# Patient Record
Sex: Female | Born: 1935 | Race: White | Hispanic: No | Smoking: Never smoker
Health system: Southern US, Community
[De-identification: ages and names within clinical notes are randomized; demographics above are authoritative.]

## PROBLEM LIST (undated history)

## (undated) DIAGNOSIS — N390 Urinary tract infection, site not specified: Secondary | ICD-10-CM

## (undated) DIAGNOSIS — C50919 Malignant neoplasm of unspecified site of unspecified female breast: Secondary | ICD-10-CM

## (undated) DIAGNOSIS — I10 Essential (primary) hypertension: Secondary | ICD-10-CM

## (undated) HISTORY — PX: BREAST LUMPECTOMY: SHX2

---

## 2013-09-01 LAB — CBC WITH DIFFERENTIAL/PLATELET
BASOS ABS: 0.1 10*3/uL (ref 0.0–0.1)
Basophil %: 0.5 %
EOS ABS: 0.3 10*3/uL (ref 0.0–0.7)
Eosinophil %: 1.5 %
HCT: 45.4 % (ref 35.0–47.0)
HGB: 14.3 g/dL (ref 12.0–16.0)
LYMPHS ABS: 2.5 10*3/uL (ref 1.0–3.6)
Lymphocyte %: 11.3 %
MCH: 29.7 pg (ref 26.0–34.0)
MCHC: 31.4 g/dL — AB (ref 32.0–36.0)
MCV: 95 fL (ref 80–100)
Monocyte #: 0.5 x10 3/mm (ref 0.2–0.9)
Monocyte %: 2.2 %
Neutrophil #: 18.7 10*3/uL — ABNORMAL HIGH (ref 1.4–6.5)
Neutrophil %: 84.5 %
PLATELETS: 275 10*3/uL (ref 150–440)
RBC: 4.79 10*6/uL (ref 3.80–5.20)
RDW: 13.4 % (ref 11.5–14.5)
WBC: 22.2 10*3/uL — ABNORMAL HIGH (ref 3.6–11.0)

## 2013-09-01 LAB — COMPREHENSIVE METABOLIC PANEL
Albumin: 3.9 g/dL (ref 3.4–5.0)
Alkaline Phosphatase: 72 U/L
Anion Gap: 13 (ref 7–16)
BILIRUBIN TOTAL: 0.4 mg/dL (ref 0.2–1.0)
BUN: 23 mg/dL — AB (ref 7–18)
CALCIUM: 9.7 mg/dL (ref 8.5–10.1)
CHLORIDE: 103 mmol/L (ref 98–107)
CO2: 25 mmol/L (ref 21–32)
Creatinine: 1.58 mg/dL — ABNORMAL HIGH (ref 0.60–1.30)
EGFR (Non-African Amer.): 30 — ABNORMAL LOW
GFR CALC AF AMER: 35 — AB
Glucose: 215 mg/dL — ABNORMAL HIGH (ref 65–99)
Osmolality: 291 (ref 275–301)
Potassium: 3.5 mmol/L (ref 3.5–5.1)
SGOT(AST): 39 U/L — ABNORMAL HIGH (ref 15–37)
SGPT (ALT): 28 U/L
SODIUM: 141 mmol/L (ref 136–145)
Total Protein: 8.1 g/dL (ref 6.4–8.2)

## 2013-09-01 LAB — LIPASE, BLOOD: LIPASE: 146 U/L (ref 73–393)

## 2013-09-02 ENCOUNTER — Inpatient Hospital Stay: Payer: Self-pay | Admitting: Internal Medicine

## 2013-09-02 LAB — CBC WITH DIFFERENTIAL/PLATELET
Basophil #: 0.1 10*3/uL (ref 0.0–0.1)
Basophil %: 0.3 %
EOS PCT: 0.1 %
Eosinophil #: 0 10*3/uL (ref 0.0–0.7)
HCT: 37.2 % (ref 35.0–47.0)
HGB: 12 g/dL (ref 12.0–16.0)
LYMPHS PCT: 6 %
Lymphocyte #: 1.1 10*3/uL (ref 1.0–3.6)
MCH: 30.4 pg (ref 26.0–34.0)
MCHC: 32.4 g/dL (ref 32.0–36.0)
MCV: 94 fL (ref 80–100)
Monocyte #: 1.3 x10 3/mm — ABNORMAL HIGH (ref 0.2–0.9)
Monocyte %: 7.1 %
Neutrophil #: 16 10*3/uL — ABNORMAL HIGH (ref 1.4–6.5)
Neutrophil %: 86.5 %
PLATELETS: 201 10*3/uL (ref 150–440)
RBC: 3.96 10*6/uL (ref 3.80–5.20)
RDW: 13.2 % (ref 11.5–14.5)
WBC: 18.5 10*3/uL — ABNORMAL HIGH (ref 3.6–11.0)

## 2013-09-02 LAB — URINALYSIS, COMPLETE
BACTERIA: NONE SEEN
Bilirubin,UR: NEGATIVE
Glucose,UR: 50 mg/dL (ref 0–75)
KETONE: NEGATIVE
Nitrite: NEGATIVE
Ph: 6 (ref 4.5–8.0)
RBC,UR: 2 /HPF (ref 0–5)
Renal Epithelial: <1
SQUAMOUS EPITHELIAL: NONE SEEN
Specific Gravity: 1.01 (ref 1.003–1.030)

## 2013-09-02 LAB — CLOSTRIDIUM DIFFICILE(ARMC)

## 2013-09-02 LAB — TROPONIN I: Troponin-I: <0.02 ng/mL

## 2013-09-03 LAB — CBC WITH DIFFERENTIAL/PLATELET
Basophil #: 0 10*3/uL (ref 0.0–0.1)
Basophil %: 0.3 %
Eosinophil #: 0 10*3/uL (ref 0.0–0.7)
Eosinophil %: 0.1 %
HCT: 34.8 % — ABNORMAL LOW (ref 35.0–47.0)
HGB: 11.7 g/dL — ABNORMAL LOW (ref 12.0–16.0)
Lymphocyte #: 1.6 10*3/uL (ref 1.0–3.6)
Lymphocyte %: 8.3 %
MCH: 31 pg (ref 26.0–34.0)
MCHC: 33.5 g/dL (ref 32.0–36.0)
MCV: 93 fL (ref 80–100)
Monocyte #: 1.6 x10 3/mm — ABNORMAL HIGH (ref 0.2–0.9)
Monocyte %: 8.7 %
NEUTROS ABS: 15.6 10*3/uL — AB (ref 1.4–6.5)
Neutrophil %: 82.6 %
Platelet: 197 10*3/uL (ref 150–440)
RBC: 3.77 10*6/uL — ABNORMAL LOW (ref 3.80–5.20)
RDW: 13.3 % (ref 11.5–14.5)
WBC: 18.9 10*3/uL — ABNORMAL HIGH (ref 3.6–11.0)

## 2013-09-03 LAB — LACTATE DEHYDROGENASE: LDH: 134 U/L (ref 81–246)

## 2013-09-03 LAB — BASIC METABOLIC PANEL
ANION GAP: 9 (ref 7–16)
BUN: 11 mg/dL (ref 7–18)
CHLORIDE: 112 mmol/L — AB (ref 98–107)
CO2: 22 mmol/L (ref 21–32)
Calcium, Total: 8.1 mg/dL — ABNORMAL LOW (ref 8.5–10.1)
Creatinine: 1.04 mg/dL (ref 0.60–1.30)
EGFR (African American): 58 — ABNORMAL LOW
EGFR (Non-African Amer.): 50 — ABNORMAL LOW
Glucose: 124 mg/dL — ABNORMAL HIGH (ref 65–99)
Osmolality: 286 (ref 275–301)
POTASSIUM: 3.9 mmol/L (ref 3.5–5.1)
Sodium: 143 mmol/L (ref 136–145)

## 2013-09-04 LAB — CBC WITH DIFFERENTIAL/PLATELET
BASOS ABS: 0.1 10*3/uL (ref 0.0–0.1)
Basophil %: 0.4 %
EOS PCT: 0.7 %
Eosinophil #: 0.1 10*3/uL (ref 0.0–0.7)
HCT: 29.7 % — ABNORMAL LOW (ref 35.0–47.0)
HGB: 9.7 g/dL — ABNORMAL LOW (ref 12.0–16.0)
LYMPHS ABS: 1.9 10*3/uL (ref 1.0–3.6)
LYMPHS PCT: 11.4 %
MCH: 30.7 pg (ref 26.0–34.0)
MCHC: 32.7 g/dL (ref 32.0–36.0)
MCV: 94 fL (ref 80–100)
MONO ABS: 1 x10 3/mm — AB (ref 0.2–0.9)
Monocyte %: 6.4 %
NEUTROS ABS: 13.2 10*3/uL — AB (ref 1.4–6.5)
NEUTROS PCT: 81.1 %
PLATELETS: 151 10*3/uL (ref 150–440)
RBC: 3.16 10*6/uL — ABNORMAL LOW (ref 3.80–5.20)
RDW: 13.5 % (ref 11.5–14.5)
WBC: 16.3 10*3/uL — ABNORMAL HIGH (ref 3.6–11.0)

## 2013-09-05 LAB — CBC WITH DIFFERENTIAL/PLATELET
BASOS PCT: 0.5 %
Basophil #: 0.1 10*3/uL (ref 0.0–0.1)
Eosinophil #: 0.3 10*3/uL (ref 0.0–0.7)
Eosinophil %: 2.8 %
HCT: 29.7 % — AB (ref 35.0–47.0)
HGB: 9.7 g/dL — ABNORMAL LOW (ref 12.0–16.0)
LYMPHS ABS: 1.6 10*3/uL (ref 1.0–3.6)
Lymphocyte %: 13.8 %
MCH: 30.4 pg (ref 26.0–34.0)
MCHC: 32.6 g/dL (ref 32.0–36.0)
MCV: 93 fL (ref 80–100)
MONO ABS: 0.8 x10 3/mm (ref 0.2–0.9)
Monocyte %: 6.9 %
NEUTROS ABS: 9 10*3/uL — AB (ref 1.4–6.5)
Neutrophil %: 76 %
Platelet: 158 10*3/uL (ref 150–440)
RBC: 3.19 10*6/uL — AB (ref 3.80–5.20)
RDW: 13.4 % (ref 11.5–14.5)
WBC: 11.8 10*3/uL — AB (ref 3.6–11.0)

## 2013-09-07 LAB — CULTURE, BLOOD (SINGLE)

## 2014-05-11 NOTE — Consult Note (Signed)
PATIENT NAME:  Amber Hunter, Amber Hunter MR#:  [id number] DATE OF BIRTH:  04-21-1928  DATE OF CONSULTATION:  09/02/2013  REFERRING PHYSICIAN:   CONSULTING PHYSICIAN:  Manya Silvas, MD  REFERRING DOCTOR: Dr. Jeannie Fend.  HISTORY OF PRESENT ILLNESS: The patient is an 81 year old, white female, who presents with acute onset of abdominal pain and diarrhea. She says she has had lots of constipation for the last 6 months. The pain started about 8:00 last night. She had several bowel movements yesterday. She describes those stools as loose, and after being admitted to the hospital, she had bloody stools also. I was asked to see her in consultation.   The patient complains of significant pain in the mid abdomen and left abdomen, very similar to a spell that she had 3 months ago when she was seen in [facility].   The patient was evaluated here in our ER and was found to have white count of 22,000, and an abnormal CAT scan, and was admitted to the hospital. I was consulted by the hospitalist.   REVIEW OF SYSTEMS: No fever. No recent change in vision or hearing. No asthma, wheezing or emphysema. No known cardiac disease. No prior heart disease. No prior strokes. No known arterial blockages. She does say that occasionally meat will get hung in her esophagus and sometimes she has to spit it out. She does have dysuria. No history of kidney stones. No fever and no vomiting, though she does have nausea and abdominal pain. She also complains of dizziness when she gets up today.   HABITS: Half-a-pack to a pack-a-day smoker from her teenage years to about age 29, then quit.   PAST SURGICAL HISTORY: She has had a cholecystectomy and a hysterectomy with ovaries removed, and when she was a teenager, she had an appendectomy. She had arthroscopic of a septic right shoulder and a hip replacement with revision.   FAMILY HISTORY: She has 2 sons who were heavy drinkers and abused drugs, and died in their late 7s.  Father died of prostate cancer, and a sister died of esophageal cancer.     MEDICATIONS: Xanax 0.5 mg 1 tablet t.i.d. p.r.n. for anxiety, tramadol 50 mg 1 tablet q.4 h. p.r.n. for pain, lisinopril dose uncertain, gabapentin 301 mg 1 capsule at bedtime.   ALLERGIES: No known drug allergies.   LABORATORY DATA: BUN 23, creatinine 1.58, AST 39, ALT 28. Troponin negative. White blood count 22.2. C. difficile stool was negative. Glucose 215.  Urine shows 24 white cells per high-powered field.   Lactic acid level elevated at 2.8.   CAT scan of the abdomen showed mild dilated fluid-filled colon to the level of the sigmoid colon with areas of mild wall thickening seen in the splenic flexure and descending colon compatible with colitis. Also sigmoid diverticulosis, and a large hiatal hernia.   PHYSICAL EXAMINATION:  GENERAL: Shows an elderly white female who looks to be uncomfortable.  VITAL SIGNS: Temperature 98.8, pulse 118, respirations 20, blood pressure 134/80, pulse oximetry 95%.  HEENT: Sclerae nonicteric. Conjunctivae negative. The head is atraumatic. Trachea is in the midline.  CHEST: Clear.  HEART: No murmurs or gallops I can hear.  ABDOMEN: Bowel sounds are present, somewhat diminished. There is tenderness diffusely, worse on the left, upper and lower.  SKIN: Warm and dry. No obvious rashes.  PSYCHIATRIC: The patient seems to be a good historian with appropriate mood.   ASSESSMENT: Given her acute onset of abdominal pain, followed later by diarrhea and now  with bleeding with abnormalities in the splenic flexure and descending colon, she said this is very similar to the previous episode she had a few months ago at [facility]. Very likely, she has ischemic colitis. Half of the time, ischemic colitis can be due to large vessel disease, and half of the time, it can be due to small vessel disease, which did not show up on mesenteric radiological studies. This can be a very serious  disease, especially in elderly patients, and infrequently requires surgery. The medical management of this is fluid hydration, IV antibiotics and medication for pain and nausea if needed. I would definitely continue her Xanax.   I have talked with Dr. Verdell Carmine about the diagnosis and possible treatment plans and I will follow with you. It may be a good idea to bring surgery on board in case her situation deteriorates.   ____________________________ Manya Silvas, MD rte:JT D: 09/02/2013 13:27:54 ET T: 09/02/2013 13:40:42 ET JOB#: [id number]  cc: Manya Silvas, MD, <Dictator> Loney Hering, MD Belia Heman. Verdell Carmine, MD  Manya Silvas MD ELECTRONICALLY SIGNED 09/16/2013 12:58

## 2014-05-11 NOTE — Consult Note (Signed)
Brief Consult Note: Diagnosis: abdominal pain likely ischemic colitis, advanced age, history of tobacco usage.   Patient was seen by consultant.   Recommend further assessment or treatment.   Comments: looks like splenic flexure ischemic colitis, abd tender but at present no clear indication for surgical intervention, i.e colectomy, colostomy.  will ask for lactic acid to be repeated. cont abx, ivf's, will need re-examine, if clinically deteriorates needs surgical intervention and colostomy. difficult problem.  Electronic Signatures: Sherri Rad (MD)  (Signed 17-Aug-15 16:20)  Authored: Brief Consult Note   Last Updated: 17-Aug-15 16:20 by Sherri Rad (MD)

## 2014-05-11 NOTE — Consult Note (Signed)
VSS afebrile, WBC 16K, hgb 9.4 still with signif abd pain and tenderness, no bleeding now.  Continue current regimen, still very tender on my exam. No new suggestions.  Electronic Signatures: Manya Silvas (MD)  (Signed on 18-Aug-15 18:17)  Authored  Last Updated: 18-Aug-15 18:17 by Manya Silvas (MD)

## 2014-05-11 NOTE — Consult Note (Signed)
PATIENT NAME:  Amber Hunter, Amber Hunter MR#:  [id number] DATE OF BIRTH:  March 05, 1928  DATE OF CONSULTATION:  09/03/2013  REFERRING PHYSICIAN:   CONSULTING PHYSICIAN:  Astaria Nanez A. Marina Gravel, MD  REASON FOR CONSULTATION: Abdominal pain and colitis.   HISTORY: This is an 81 year old white female with a history of peripheral neuropathy, hypertension, breast cancer, anxiety, and osteoarthritis who presents to the hospital and admitted to the medical service with approximately a 1-week history of diffuse abdominal pain. She did have some diarrhea over the course of the weekend. She admits to not having diarrhea but the chart states that she has. Over the last 3 days prior to her admission, the patient has had rumbling and significant tenderness throughout her abdomen. No vomiting. No fevers. No chills. No sick contacts. She states that she had symptoms 3 months ago and was treated with antibiotics at [facility]. None of this history is obtained from the patient. I asked her if she has had this problem before and she said no. She is admitted with dehydration, colitis, acute kidney injury and sepsis. GI medicine has become involved. Surgical services had been requested for evaluation due to the patient's degree of abdominal pain.   ALLERGIES: None.   HOME MEDICATIONS:  1. Gabapentin.  2. Lisinopril.  3. Tramadol.  4. Xanax.   PAST SURGICAL HISTORY: Significant for cholecystectomy, hysterectomy, arthroscopy, breast lumpectomy appendectomy, hip replacement and revision.   SOCIAL HISTORY: Lives with her grandson. Does not smoke or drink but has smoked in the past.   REVIEW OF SYSTEMS: As described in the history of present illness per the admitting physician. Any review of systems today that I would obtain would not be reliable.   PHYSICAL EXAMINATION: GENERAL: The patient is elderly appearing, frail, in no obvious distress.  VITAL SIGNS: Temperature 100.2, pulse of 101, blood pressure is 100/62, room  air saturation is 94%.  LUNGS: Clear.  HEART: Tachycardic. No obvious murmur.  ABDOMEN: Slightly distended and tender throughout, but mainly in the left lower quadrant, with multiple scars. No obvious hernia. No obvious mass.  EXTREMITIES: Warm and well perfused.  NEUROLOGIC AND PSYCHIATRIC: Unremarkable. History is somewhat limited.   DIAGNOSTIC DATA: Admission lactic acid is 2.8. Troponin was normal. Admission white count 22.2; this morning is 18.9. Hemoglobin 11.7, platelet count 197,000 with left shift neutrophils. Electrolytes: Creatinine initially 1.58, now down to 1.04, sodium 143, potassium 3.9, chloride 112. Glucose 124; admission glucose 215.  Review of CT scan. There are diffuse changes within the splenic flexure and descending colon with bowel wall thickening and fluid-filled loops of large bowel. The appendix and gallbladder and uterus are surgically absent. I see no evidence of pneumatosis intestinalis, microperforation, or free fluid within the abdomen. There is no free air.   IMPRESSION: This 81 year old white female presents with what appears to be ischemic colitis in the region of the splenic flexure. At present, I see no acute surgical indication requiring colectomy and colostomy.   Plans:  The patient is to be re-examined, hydrated, continue intravenous Cipro and Flagyl as you are doing and certainly, if she worsens, she will need a colectomy with colostomy. I will attempt to contact family as patient seems somewhat confused and unaware of the history.    ____________________________ Jeannette How Marina Gravel, MD mab:NT D: 09/03/2013 17:22:51 ET T: 09/03/2013 17:42:03 ET JOB#: [id number]  cc: Elta Guadeloupe A. Marina Gravel, MD, <Dictator> Hortencia Conradi MD ELECTRONICALLY SIGNED 09/09/2013 14:45

## 2014-05-11 NOTE — H&P (Signed)
PATIENT NAME:  Amber Hunter, GAVITT MR#:  [id number] DATE OF BIRTH:  1928-03-14  DATE OF ADMISSION:  09/02/2013  REFERRING PHYSICIAN: Loney Hering, M.D.   PRIMARY CARE DOCTOR: Nonlocal.  ADMITTING DIAGNOSIS: Sepsis, acute kidney injury, dehydration and colitis.   HISTORY OF PRESENT ILLNESS: This is an 81 year old, Caucasian female, who presents to the Emergency Department with acute onset of diarrhea. The patient states that she has not been feeling well for the last 3 weeks, and has been treated with antibiotics for chronic bronchitis. Her belly had been "rumbling" and mildly tender throughout that time, but over the last 3 days, her appetite has decreased and she developed nonbloody diarrhea today. She has not had any vomiting, fever or chills. The patient admits that she had the same symptoms 3 months ago and was treated with antibiotics, at that time, at [facility]. The patient was found to have leukocytosis, as well as acute kidney injury, so I was called by the Emergency Department for admission.   REVIEW OF SYSTEMS:  CONSTITUTIONAL: The patient denies fever or weakness.  EYES: The patient denies decreased visual acuity or inflammation.  ENT: The patient denies nosebleeds or difficulty swallowing.  RESPIRATORY: The patient denies cough or shortness of breath.  CARDIOVASCULAR: The patient denies chest pain or orthopnea.  GASTROINTESTINAL: The patient denies nausea or vomiting, but admits to diarrhea and abdominal pain.  GENITOURINARY: The patient denies dysuria or increased frequency or hesitancy.  ENDOCRINE: The patient denies nocturia or temperature intolerance. HEMATOLOGIC AND LYMPHATIC: The patient denies bleeding or easy bruising.  INTEGUMENTARY: The patient denies rash or lesions.  MUSCULOSKELETAL: The patient admits to chronic back pain, but denies myalgias.  NEUROLOGIC: The patient denies numbness, weakness, difficulty swallowing or speaking.  PSYCHIATRIC: The  patient denies depression or suicidal ideation.   PAST MEDICAL HISTORY: Significant for peripheral neuropathy, osteoarthritis, right arm pain, hypertension, breast cancer status post lumpectomy and chemotherapy, and anxiety.   PAST SURGICAL HISTORY: Arthroscopy of septic right shoulder, lumpectomy, hysterectomy, appendectomy, cholecystectomy, and hip replacement and revision.   FAMILY HISTORY: Significant for hypertension, colon cancer in her father. He is deceased of this condition. Also esophageal cancer, and 2 of her sons who had liver cancer and are deceased of this condition, as well.   SOCIAL HISTORY: The patient lives with her grandson. She does not smoke, drink or do any drugs.   MEDICATIONS: Include Xanax 0.5 mg 1 tablet p.o. t.i.d. as needed for anxiety, tramadol 50 mg 1 tablet p.o. every 4 hours as needed for pain, lisinopril dose unavailable, gabapentin 300 mg capsule 1 capsule p.o. at bedtime.   ALLERGIES: No known drug allergies.   PERTINENT LABORATORY RESULTS AND RADIOGRAPHIC FINDINGS: Glucose 215, BUN 23, creatinine 1.58, AST is 39, ALT 28. Troponin is negative. White blood cell count 22.2. Clostridium difficile assay is negative.   There are 100 mg of protein per deciliter in her urine and there are 24 white blood cells per high-power field, nitrites negative, leukocyte esterase trace.   The patient has a lactic acid level of 2.8.  CT of the abdomen and pelvis with contrast shows mild dilated fluid-filled colon to the level of the sigmoid colon with areas of mild wall thickening best seen in the splenic flexure and within the descending colon, but is compatible with colitis. The patient also has sigmoid diverticulosis and a large hiatal hernia.    PHYSICAL EXAMINATION:  VITAL SIGNS: Temperature is 97.8, pulse 108, respirations 20, blood pressure 129/64, pulse  oximetry is 97% on room air.  GENERAL: The patient is alert and oriented x 3, in no apparent distress.  HEENT:  Normocephalic, atraumatic. Pupils equal, round, and reactive to light and accommodation. Extraocular movements are intact. The patient's mucous membranes are slightly dry. The oropharynx is without exudate or erythema.  NECK: Trachea is midline. No adenopathy.  CHEST: Symmetric and atraumatic.  CARDIOVASCULAR: Regular rate and rhythm. Normal S1, S2. No rubs, clicks or murmurs.  LUNGS: Clear to auscultation bilaterally. Normal effort and excursion.  ABDOMEN: Positive bowel sounds, soft, nondistended; however, there is mild tenderness in the lower quadrants bilaterally without guarding or rebound tenderness.  GENITOURINARY: Deferred.  MUSCULOSKELETAL: The patient moves all 4 extremities equally.  SKIN: No rashes or lesions.  EXTREMITIES: No clubbing, cyanosis, or edema.  NEUROLOGIC: Cranial nerves II through XII are grossly intact.  PSYCHIATRIC: Mood is normal. Affect is congruent.   ASSESSMENT AND PLAN: This is an 81 year old female with dehydration secondary to diarrhea, who meets criteria for sepsis.   1. Sepsis. The patient is tachycardic and tachypneic. She is not hypotensive or febrile. She has received a dose of ciprofloxacin in the Emergency Department. We will continue ciprofloxacin and Flagyl. The patient has lactic acidosis. This is likely coming from her gut, but could be due to decreased intravascular volume secondary to her diarrhea. Her physical examination is not consistent with ischemic colitis, but we will have to keep a close eye not only on the quality or character of her stool, but also on her serial abdominal examinations. We will reorder lactic acid following hydration.  2. Dehydration, likely secondary to diarrhea. We will aggressively hydrate the patient. She does not have any congestive heart failure.  3. Acute kidney injury secondary to dehydration. Treatment is intravenous fluid, as discussed.  4. Diarrhea. Clostridium difficile is negative. She still has colitis, which,  at this time, could be ischemic; however, the patient's examination is not consistent with that. Furthermore, she has had 3 weeks of somewhat progressive symptoms, which have only affected her appetite and eating habits until essentially 3 days ago. She has not seen any blood in her stool, and, again, my physical examination is not consistent with mesenteric ischemia. Due to sepsis, we will continue antibiotics at this time. I have also ordered probiotics for the patient.  5. Hypertension. We will continue the patient's home medicines.  6. Anxiety. Continue Xanax.  7. Deep vein thrombosis prophylaxis with sequential compression devices.  8. Gastrointestinal prophylaxis with pantoprazole.   CODE STATUS: The patient is a Full Code.   TIME SPENT ON ADMISSION ORDERS AND PATIENT CARE: Approximately 45 minutes.    ____________________________ Norva Riffle. Marcille Blanco, MD msd:JT D: 09/02/2013 06:34:53 ET T: 09/02/2013 07:25:19 ET JOB#: [id number]  cc: Norva Riffle. Marcille Blanco, MD, <Dictator> Norva Riffle Zong Mcquarrie MD ELECTRONICALLY SIGNED 09/03/2013 2:29

## 2014-05-11 NOTE — Consult Note (Signed)
Pt feeling much better, much less tenderness, no bleeding and only had 2 bowel movements today.  Abd exam without tenderness.  Should go home on a few days of oral antibiotics and stay hydrated, slowly advance diet.  Electronic Signatures: Manya Silvas (MD)  (Signed on 19-Aug-15 18:30)  Authored  Last Updated: 19-Aug-15 18:30 by Manya Silvas (MD)

## 2014-05-11 NOTE — Consult Note (Signed)
Pt with ischemic colitis, tmax 100.2, still has signif pain in abd with coughing or gentle palpation.  WBC down to 18k.  Getting pain med prn.  Recommend surgical consult in case condition deteriorates.  Chest clear anterior lat fields..  bowel sounds present, tender to gentle palpation.     Electronic Signatures: Manya Silvas (MD) (Signed on 17-Aug-15 12:46)  Authored   Last Updated: 17-Aug-15 12:49 by Manya Silvas (MD)

## 2014-05-11 NOTE — Discharge Summary (Signed)
PATIENT NAME:  Amber Hunter, Amber Hunter MR#:  [id number] DATE OF BIRTH:  September 20, 1928  DATE OF ADMISSION:  09/02/2013 DATE OF DISCHARGE:  09/06/2013  ADMITTING DIAGNOSES: Diarrhea, abdominal pain.   DISCHARGE DIAGNOSES: 1.  Sepsis due to acute colitis.  2.  Acute colitis, likely ischemic in nature. Negative stool for Clostridium difficile.  3.  Dehydration.  4.  Acute kidney injury due to dehydration.  5.  Hypertension.  6.  Anxiety.  7.  Peripheral neuropathy.  8.  Osteoarthritis.  9.  Chronic right arm pain.  10.  History of breast cancer status post lumpectomy and chemotherapy.  11.  Status post arthroscopy of septic right shoulder.  12.  Status post lumpectomy.  13.  Status post hysterectomy.  14.  Status post appendectomy. 15.  Status post cholecystectomy. 16.  Status post hip replacement and revision.   CONSULTANTS: Gastroenterology - Dr. Vira Agar; Surgery, Dr. Sherri Rad.  PERTINENT DIAGNOSTIC DATA: Admitting glucose 215, BUN 23, creatinine 1.58, sodium 141, potassium 3.5, chloride 103, CO2 25, calcium 9.7. LFTs are normal, except AST of 39. WBC 22.2, hemoglobin 14.3, platelet count 275,000.   Blood cultures x2: No growth. Stool for C. diff was negative. Lactic acid 2.8.  CT scan of the abdomen and pelvis showed mild dilation of fluid filled colon to the level of the sigmoid colon, areas of mild wall thickening best seen in the splenic flexure and within the descending colon compatible with colitis.   HOSPITAL COURSE: Please refer to the H and P done by the admitting physician. The patient is an 81 year old white female with history of above-stated medical problems who presented with abdominal pain and diarrhea. The patient had a CT scan in the ED and was noted to have colitis. The location of her colitis was felt to be likely due to ischemic in nature. Apparently, the patient was admitted at [facility] 3 months prior and was treated with antibiotics at that time for similar  symptoms. Due to her symptomology, she was admitted and was seen by GI and surgery. They recommended keeping the patient on IV antibiotics. The patient, at the time of discharge, her diarrhea has resolved, abdominal pain has resolved. Her renal function is improved. Currently, she is doing much better and is stable for discharge. The patient will need outpatient GI follow up and possible consideration for colonoscopy.   DISCHARGE MEDICATIONS: Xanax 0.5 one tab p.o. t.i.d. as needed, gabapentin 300 at bedtime, tramadol 50 one tab p.o. q. 4 hours p.r.n. for pain, lisinopril 1 tab p.o. daily as taking at home, Flagyl 500 one tab p.o. every 8 hours for 4 more days, Cipro 500 one tab p.o. q. 12 for 4 more days, lactobacillus 1 tab p.o. b.i.d.   DISCHARGE DIET: Low-sodium, low-fat, low-cholesterol, low-residual diet.   DISCHARGE ACTIVITY: As tolerated.   DISCHARGE FOLLOWUP: Follow up with primary MD in 1 to 2 weeks. Follow up with Dr. Vira Agar in 2 to 4 weeks.  TIME SPENT: 35 minutes.  ____________________________ Lafonda Mosses Posey Pronto, MD shp:sb D: 09/07/2013 08:22:51 ET T: 09/07/2013 10:54:34 ET JOB#: [id number]  cc: Kinzie Wickes H. Posey Pronto, MD, <Dictator> Alric Seton MD ELECTRONICALLY SIGNED 09/09/2013 13:56

## 2018-01-15 ENCOUNTER — Other Ambulatory Visit: Payer: Self-pay

## 2018-01-15 ENCOUNTER — Emergency Department (HOSPITAL_BASED_OUTPATIENT_CLINIC_OR_DEPARTMENT_OTHER)
Admission: EM | Admit: 2018-01-15 | Discharge: 2018-01-15 | Disposition: A | Discharge status: Home / Self Care | Payer: Medicare Other | Attending: Emergency Medicine | Admitting: Emergency Medicine

## 2018-01-15 ENCOUNTER — Emergency Department (HOSPITAL_BASED_OUTPATIENT_CLINIC_OR_DEPARTMENT_OTHER): Payer: Medicare Other

## 2018-01-15 ENCOUNTER — Encounter (HOSPITAL_BASED_OUTPATIENT_CLINIC_OR_DEPARTMENT_OTHER): Payer: Self-pay | Admitting: Emergency Medicine

## 2018-01-15 DIAGNOSIS — R05 Cough: Secondary | ICD-10-CM | POA: Diagnosis present

## 2018-01-15 DIAGNOSIS — J209 Acute bronchitis, unspecified: Secondary | ICD-10-CM | POA: Diagnosis not present

## 2018-01-15 DIAGNOSIS — I10 Essential (primary) hypertension: Secondary | ICD-10-CM | POA: Diagnosis not present

## 2018-01-15 HISTORY — DX: Essential (primary) hypertension: I10

## 2018-01-15 MED ORDER — AMOXICILLIN-POT CLAVULANATE 500-125 MG PO TABS: 1.0000 | ORAL_TABLET | Freq: Three times a day (TID) | ORAL | 0 refills | Status: DC

## 2018-01-15 NOTE — ED Triage Notes (Signed)
Pt having some cold like symptoms and bilateral arm rash. Pt c/o 6/10 Headache due to the cold.

## 2018-01-15 NOTE — Discharge Instructions (Addendum)
Augmentin as prescribed.  Continue over-the-counter medications as needed for relief of symptoms.  Return to the emergency department if you develop chest pain, difficulty breathing, or other new and concerning symptoms.

## 2018-01-15 NOTE — ED Provider Notes (Signed)
[facility] Provider Note   CSN: [id number] Arrival date & time: 01/15/18  1942     History   Chief Complaint Chief Complaint  Patient presents with  . Cough    HPI Amber Hunter is a 84 y.o. female.  Patient is an 84 year old female with history of hypertension.  She presents today for evaluation of chest congestion and cough.  This is been ongoing for the past several days.  She denies any chest pain, difficulty breathing, or leg swelling.  She denies fevers or chills.  She denies any ill contacts.  The history is provided by the patient.  Cough  This is a new problem. Episode onset: 3 days ago. The problem occurs constantly. The problem has been gradually worsening. The cough is non-productive. There has been no fever. Pertinent negatives include no chest pain and no sore throat. She has tried decongestants for the symptoms. The treatment provided mild relief. She is not a smoker.    Past Medical History:  Diagnosis Date  . Hypertension     There are no active problems to display for this patient.   Past Surgical History:  Procedure Laterality Date  . BREAST LUMPECTOMY       OB History   No obstetric history on file.      Home Medications    Prior to Admission medications   Not on File    Family History History reviewed. No pertinent family history.  Social History Social History   Tobacco Use  . Smoking status: Never Smoker  . Smokeless tobacco: Never Used  Substance Use Topics  . Alcohol use: Never    Frequency: Never  . Drug use: Never     Allergies   Patient has no known allergies.   Review of Systems Review of Systems  HENT: Negative for sore throat.   Respiratory: Positive for cough.   Cardiovascular: Negative for chest pain.  All other systems reviewed and are negative.    Physical Exam Updated Vital Signs BP (!) 143/85 (BP Location: Right Arm)   Pulse (!) 109   Temp 99.7 F (37.6 C) (Oral)    Resp 19   Ht 5\' 3"  (1.6 m)   Wt 63.5 kg   SpO2 93%   BMI 24.80 kg/m   Physical Exam Vitals signs and nursing note reviewed.  Constitutional:      General: She is not in acute distress.    Appearance: Normal appearance. She is well-developed. She is not diaphoretic.  HENT:     Head: Normocephalic and atraumatic.     Nose: No congestion or rhinorrhea.     Mouth/Throat:     Mouth: Mucous membranes are moist.     Pharynx: No oropharyngeal exudate.  Neck:     Musculoskeletal: Normal range of motion and neck supple.  Cardiovascular:     Rate and Rhythm: Normal rate and regular rhythm.     Heart sounds: No murmur. No friction rub. No gallop.   Pulmonary:     Effort: Pulmonary effort is normal. No respiratory distress.     Breath sounds: Normal breath sounds. No wheezing.  Abdominal:     General: Bowel sounds are normal. There is no distension.     Palpations: Abdomen is soft.     Tenderness: There is no abdominal tenderness.  Musculoskeletal: Normal range of motion.  Skin:    General: Skin is warm and dry.  Neurological:     Mental Status: She is alert and  oriented to person, place, and time.      ED Treatments / Results  Labs (all labs ordered are listed, but only abnormal results are displayed) Labs Reviewed - No data to display  EKG None  Radiology Dg Chest 2 View  Result Date: 01/15/2018 CLINICAL DATA:  Shortness of breath. EXAM: CHEST - 2 VIEW COMPARISON:  None. FINDINGS: The heart size and mediastinal contours are within normal limits. Both lungs are clear. Large hiatal hernia is noted. No pneumothorax or pleural effusion is noted. The visualized skeletal structures are unremarkable. IMPRESSION: No active cardiopulmonary disease.  Large hiatal hernia. Electronically Signed   By: Marijo Conception, M.D.   On: 01/15/2018 20:40    Procedures Procedures (including critical care time)  Medications Ordered in ED Medications - No data to display   Initial  Impression / Assessment and Plan / ED Course  I have reviewed the triage vital signs and the nursing notes.  Pertinent labs & imaging results that were available during my care of the patient were reviewed by me and considered in my medical decision making (see chart for details).  Patient presenting with upper respiratory symptoms, most likely viral in nature.  Due to the patient's age, I will prescribe antibiotics in case this is bacterial.  She is to continue over-the-counter medications and return as needed.  Her chest x-ray is clear and she otherwise appears well.  Final Clinical Impressions(s) / ED Diagnoses   Final diagnoses:  None    ED Discharge Orders    None       Veryl Speak, MD 01/15/18 2312

## 2019-08-01 ENCOUNTER — Encounter (HOSPITAL_BASED_OUTPATIENT_CLINIC_OR_DEPARTMENT_OTHER): Payer: Self-pay

## 2019-08-01 ENCOUNTER — Inpatient Hospital Stay (HOSPITAL_BASED_OUTPATIENT_CLINIC_OR_DEPARTMENT_OTHER)
Admission: EM | Admit: 2019-08-01 | Discharge: 2019-08-13 | DRG: 388 | Disposition: A | Discharge status: Home Health | Payer: Medicare Other | Attending: Family Medicine | Admitting: Family Medicine

## 2019-08-01 ENCOUNTER — Emergency Department (HOSPITAL_BASED_OUTPATIENT_CLINIC_OR_DEPARTMENT_OTHER): Payer: Medicare Other

## 2019-08-01 ENCOUNTER — Other Ambulatory Visit: Payer: Self-pay

## 2019-08-01 DIAGNOSIS — K449 Diaphragmatic hernia without obstruction or gangrene: Secondary | ICD-10-CM | POA: Diagnosis present

## 2019-08-01 DIAGNOSIS — R000 Tachycardia, unspecified: Secondary | ICD-10-CM

## 2019-08-01 DIAGNOSIS — K5669 Other partial intestinal obstruction: Principal | ICD-10-CM | POA: Diagnosis present

## 2019-08-01 DIAGNOSIS — R1084 Generalized abdominal pain: Secondary | ICD-10-CM

## 2019-08-01 DIAGNOSIS — R7303 Prediabetes: Secondary | ICD-10-CM | POA: Diagnosis present

## 2019-08-01 DIAGNOSIS — E876 Hypokalemia: Secondary | ICD-10-CM | POA: Diagnosis not present

## 2019-08-01 DIAGNOSIS — K56600 Partial intestinal obstruction, unspecified as to cause: Secondary | ICD-10-CM

## 2019-08-01 DIAGNOSIS — E43 Unspecified severe protein-calorie malnutrition: Secondary | ICD-10-CM | POA: Diagnosis present

## 2019-08-01 DIAGNOSIS — R140 Abdominal distension (gaseous): Secondary | ICD-10-CM

## 2019-08-01 DIAGNOSIS — N390 Urinary tract infection, site not specified: Secondary | ICD-10-CM | POA: Diagnosis not present

## 2019-08-01 DIAGNOSIS — Z853 Personal history of malignant neoplasm of breast: Secondary | ICD-10-CM

## 2019-08-01 DIAGNOSIS — G894 Chronic pain syndrome: Secondary | ICD-10-CM | POA: Diagnosis present

## 2019-08-01 DIAGNOSIS — Z9049 Acquired absence of other specified parts of digestive tract: Secondary | ICD-10-CM

## 2019-08-01 DIAGNOSIS — G2581 Restless legs syndrome: Secondary | ICD-10-CM | POA: Diagnosis present

## 2019-08-01 DIAGNOSIS — D6489 Other specified anemias: Secondary | ICD-10-CM | POA: Diagnosis present

## 2019-08-01 DIAGNOSIS — K56609 Unspecified intestinal obstruction, unspecified as to partial versus complete obstruction: Secondary | ICD-10-CM

## 2019-08-01 DIAGNOSIS — R112 Nausea with vomiting, unspecified: Secondary | ICD-10-CM

## 2019-08-01 DIAGNOSIS — I10 Essential (primary) hypertension: Secondary | ICD-10-CM | POA: Diagnosis present

## 2019-08-01 DIAGNOSIS — Z6823 Body mass index (BMI) 23.0-23.9, adult: Secondary | ICD-10-CM

## 2019-08-01 DIAGNOSIS — B961 Klebsiella pneumoniae [K. pneumoniae] as the cause of diseases classified elsewhere: Secondary | ICD-10-CM | POA: Diagnosis not present

## 2019-08-01 DIAGNOSIS — N1831 Chronic kidney disease, stage 3a: Secondary | ICD-10-CM | POA: Diagnosis present

## 2019-08-01 DIAGNOSIS — E860 Dehydration: Secondary | ICD-10-CM | POA: Diagnosis present

## 2019-08-01 DIAGNOSIS — I129 Hypertensive chronic kidney disease with stage 1 through stage 4 chronic kidney disease, or unspecified chronic kidney disease: Secondary | ICD-10-CM | POA: Diagnosis present

## 2019-08-01 DIAGNOSIS — Z79899 Other long term (current) drug therapy: Secondary | ICD-10-CM

## 2019-08-01 DIAGNOSIS — Z20822 Contact with and (suspected) exposure to covid-19: Secondary | ICD-10-CM | POA: Diagnosis present

## 2019-08-01 DIAGNOSIS — N179 Acute kidney failure, unspecified: Secondary | ICD-10-CM | POA: Diagnosis present

## 2019-08-01 DIAGNOSIS — R32 Unspecified urinary incontinence: Secondary | ICD-10-CM | POA: Diagnosis present

## 2019-08-01 DIAGNOSIS — R739 Hyperglycemia, unspecified: Secondary | ICD-10-CM | POA: Diagnosis present

## 2019-08-01 DIAGNOSIS — A4159 Other Gram-negative sepsis: Secondary | ICD-10-CM | POA: Diagnosis not present

## 2019-08-01 DIAGNOSIS — M419 Scoliosis, unspecified: Secondary | ICD-10-CM | POA: Diagnosis present

## 2019-08-01 DIAGNOSIS — F329 Major depressive disorder, single episode, unspecified: Secondary | ICD-10-CM | POA: Diagnosis present

## 2019-08-01 DIAGNOSIS — Z0189 Encounter for other specified special examinations: Secondary | ICD-10-CM

## 2019-08-01 DIAGNOSIS — Z90710 Acquired absence of both cervix and uterus: Secondary | ICD-10-CM

## 2019-08-01 DIAGNOSIS — E872 Acidosis: Secondary | ICD-10-CM | POA: Diagnosis present

## 2019-08-01 HISTORY — DX: Malignant neoplasm of unspecified site of unspecified female breast: C50.919

## 2019-08-01 LAB — CBC
HCT: 34.3 % — ABNORMAL LOW (ref 36.0–46.0)
Hemoglobin: 10.4 g/dL — ABNORMAL LOW (ref 12.0–15.0)
MCH: 25.4 pg — ABNORMAL LOW (ref 26.0–34.0)
MCHC: 30.3 g/dL (ref 30.0–36.0)
MCV: 83.7 fL (ref 80.0–100.0)
Platelets: 510 10*3/uL — ABNORMAL HIGH (ref 150–400)
RBC: 4.1 MIL/uL (ref 3.87–5.11)
RDW: 14.1 % (ref 11.5–15.5)
WBC: 11.4 10*3/uL — ABNORMAL HIGH (ref 4.0–10.5)
nRBC: 0 % (ref 0.0–0.2)

## 2019-08-01 LAB — SARS CORONAVIRUS 2 BY RT PCR (HOSPITAL ORDER, PERFORMED IN ~~LOC~~ HOSPITAL LAB): SARS Coronavirus 2: NEGATIVE

## 2019-08-01 LAB — COMPREHENSIVE METABOLIC PANEL
ALT: 10 U/L (ref 0–44)
AST: 20 U/L (ref 15–41)
Albumin: 3.6 g/dL (ref 3.5–5.0)
Alkaline Phosphatase: 69 U/L (ref 38–126)
Anion gap: 18 — ABNORMAL HIGH (ref 5–15)
BUN: 53 mg/dL — ABNORMAL HIGH (ref 8–23)
CO2: 23 mmol/L (ref 22–32)
Calcium: 9.2 mg/dL (ref 8.9–10.3)
Chloride: 95 mmol/L — ABNORMAL LOW (ref 98–111)
Creatinine, Ser: 1.6 mg/dL — ABNORMAL HIGH (ref 0.44–1.00)
GFR calc Af Amer: 33 mL/min — ABNORMAL LOW (ref >60)
GFR calc non Af Amer: 28 mL/min — ABNORMAL LOW (ref >60)
Glucose, Bld: 214 mg/dL — ABNORMAL HIGH (ref 70–99)
Potassium: 3.7 mmol/L (ref 3.5–5.1)
Sodium: 136 mmol/L (ref 135–145)
Total Bilirubin: 0.5 mg/dL (ref 0.3–1.2)
Total Protein: 7.7 g/dL (ref 6.5–8.1)

## 2019-08-01 LAB — LACTIC ACID, PLASMA
Lactic Acid, Venous: 5 mmol/L (ref 0.5–1.9)
Lactic Acid, Venous: 2.4 mmol/L (ref 0.5–1.9)

## 2019-08-01 LAB — LIPASE, BLOOD: Lipase: 17 U/L (ref 11–51)

## 2019-08-01 MED ORDER — ONDANSETRON HCL 4 MG/2ML IJ SOLN
4.0000 mg | Freq: Four times a day (QID) | INTRAMUSCULAR | Status: DC | PRN
  Administered 2019-08-02 – 2019-08-07 (×3): 4 mg via INTRAVENOUS
  Filled 2019-08-01 (×3): qty 2

## 2019-08-01 MED ORDER — HYDRALAZINE HCL 20 MG/ML IJ SOLN
10.0000 mg | INTRAMUSCULAR | Status: DC | PRN
  Administered 2019-08-09: 10 mg via INTRAVENOUS
  Filled 2019-08-01: qty 1

## 2019-08-01 MED ORDER — ONDANSETRON HCL 4 MG PO TABS: 4.0000 mg | ORAL_TABLET | Freq: Four times a day (QID) | ORAL | Status: DC | PRN

## 2019-08-01 MED ORDER — SODIUM CHLORIDE 0.9% FLUSH
3.0000 mL | Freq: Once | INTRAVENOUS | Status: DC
  Filled 2019-08-01: qty 3

## 2019-08-01 MED ORDER — FENTANYL CITRATE (PF) 100 MCG/2ML IJ SOLN
50.0000 ug | Freq: Once | INTRAMUSCULAR | Status: AC
  Administered 2019-08-01: 50 ug via INTRAVENOUS
  Filled 2019-08-01: qty 2

## 2019-08-01 MED ORDER — ONDANSETRON HCL 4 MG/2ML IJ SOLN
INTRAMUSCULAR | Status: AC
  Administered 2019-08-01: 4 mg via INTRAVENOUS
  Filled 2019-08-01: qty 2

## 2019-08-01 MED ORDER — ONDANSETRON HCL 4 MG/2ML IJ SOLN
4.0000 mg | Freq: Once | INTRAMUSCULAR | Status: AC
  Administered 2019-08-01: 4 mg via INTRAVENOUS
  Filled 2019-08-01: qty 2

## 2019-08-01 MED ORDER — SODIUM CHLORIDE 0.9 % IV BOLUS
500.0000 mL | Freq: Once | INTRAVENOUS | Status: AC
  Administered 2019-08-01: 500 mL via INTRAVENOUS

## 2019-08-01 MED ORDER — SODIUM CHLORIDE 0.9 % IV SOLN: INTRAVENOUS | Status: DC

## 2019-08-01 NOTE — ED Notes (Signed)
Attempt to insert NGT unsuccessful, pt tolerated well.  Dr Stark Jock made aware, approved pt to await admission for further attempt since pt is not actively vomiting at this time.

## 2019-08-01 NOTE — ED Provider Notes (Signed)
Care assumed from Dr. Sherry Ruffing at shift change.  Patient awaiting results of a CT scan to evaluate for generalized abdominal pain and vomiting for the past several days.  Her laboratory studies show a leukocytosis of 11.4 and elevated lactate of 5 which has improved after IV fluids.  Patient CT scan was performed showing a partial small bowel obstruction.  This finding was discussed with Dr. Kieth Brightly from general surgery.  An NG tube will be placed and the patient will be admitted to the hospitalist service under the care of Dr. Avon Gully.   Veryl Speak, MD 08/01/19 1801

## 2019-08-01 NOTE — ED Notes (Signed)
Patient transported to CT 

## 2019-08-01 NOTE — ED Notes (Signed)
Pt on monitor 

## 2019-08-01 NOTE — H&P (Signed)
History and Physical    Amber Hunter HGD:[id number] DOB: [birth year] DOA: 08/01/2019  PCP: Delrae Alfred, DO  Patient coming from: Home.  Chief Complaint: Abdominal pain nausea vomiting.  HPI: Amber Hunter is a 86 y.o. female with history of hypertension, chronic kidney disease stage III, breast cancer in remission, restless leg syndrome, chronic pain on tramadol presents to the ER at [facility] with complaints of nausea vomiting abdominal pain for last 3 days.  Has not moved her bowels for the same time.  Pain is generalized and constant.  Denies fever chills chest pain or shortness of breath.  Denies any blood in the vomitus.  ED Course: In the ER CT abdomen pelvis shows features concerning for partial small bowel obstruction.  On-call general surgeon was consulted and patient admitted for further management of partial small bowel obstruction.  Labs are significant for elevated lactic acid which improved with fluids.  Also seen is hyperglycemia elevated creatinine hemoglobin of 10.4 mild leukocytosis Covid test was negative.  EKG was showing sinus tachycardia with LBBB.  Review of Systems: As per HPI, rest all negative.   Past Medical History:  Diagnosis Date  . Breast cancer ([place])   . Hypertension     Past Surgical History:  Procedure Laterality Date  . BREAST LUMPECTOMY       reports that she has never smoked. She has never used smokeless tobacco. She reports that she does not drink alcohol and does not use drugs.  No Known Allergies  Family History  Family history unknown: Yes    Prior to Admission medications   Medication Sig Start Date End Date Taking? Authorizing Provider  citalopram (CELEXA) 10 MG tablet Take 10 mg by mouth daily.   Yes [provider]  gabapentin (NEURONTIN) 300 MG capsule Take 300 mg by mouth daily. 07/02/19  Yes [provider]  lisinopril-hydrochlorothiazide (PRINZIDE,ZESTORETIC) 10-12.5 MG tablet Take 1 tablet by  mouth daily.   Yes [provider]  pramipexole (MIRAPEX) 0.125 MG tablet Take 0.125 mg by mouth at bedtime.   Yes [provider]  traMADol (ULTRAM) 50 MG tablet Take 50 mg by mouth every 6 (six) hours as needed for moderate pain.    Yes [provider]  traZODone (DESYREL) 50 MG tablet Take 50 mg by mouth at bedtime.   Yes [provider]  amoxicillin-clavulanate (AUGMENTIN) 500-125 MG tablet Take 1 tablet (500 mg total) by mouth every 8 (eight) hours. Patient not taking: Reported on 08/01/2019 01/15/18   Veryl Speak, MD    Physical Exam: Constitutional: Moderately built and nourished. Vitals:   08/01/19 2030 08/01/19 2100 08/01/19 2130 08/01/19 2229  BP: (!) 150/85 (!) 149/96 (!) 154/92 (!) 142/80  Pulse: (!) 108 (!) 106 (!) 110 (!) 105  Resp: 13 16 15 14   Temp:    98.2 F (36.8 C)  TempSrc:    Oral  SpO2: 95% 96% 95% 97%  Weight:      Height:       Eyes: Anicteric no pallor. ENMT: No discharge from the ears eyes nose or mouth. Neck: No mass felt.  No neck rigidity. Respiratory: No rhonchi or crepitations. Cardiovascular: S1-S2 heard. Abdomen: Soft nontender bowel sounds not appreciated. Musculoskeletal: No edema. Skin: No rash. Neurologic: Alert awake oriented time place and person.  Moves all extremities. Psychiatric: Appears normal.  Normal affect.   Labs on Admission: I have personally reviewed following labs and imaging studies  CBC: Recent Labs  Lab 08/01/19  1431  WBC 11.4*  HGB 10.4*  HCT 34.3*  MCV 83.7  PLT 622*   Basic Metabolic Panel: Recent Labs  Lab 08/01/19 1431  NA 136  K 3.7  CL 95*  CO2 23  GLUCOSE 214*  BUN 53*  CREATININE 1.60*  CALCIUM 9.2   GFR: Estimated Creatinine Clearance: 21.5 mL/min (A) (by C-G formula based on SCr of 1.6 mg/dL (H)). Liver Function Tests: Recent Labs  Lab 08/01/19 1431  AST 20  ALT 10  ALKPHOS 69  BILITOT 0.5  PROT 7.7  ALBUMIN 3.6   Recent Labs  Lab  08/01/19 1431  LIPASE 17   No results for input(s): AMMONIA in the last 168 hours. Coagulation Profile: No results for input(s): INR, PROTIME in the last 168 hours. Cardiac Enzymes: No results for input(s): CKTOTAL, CKMB, CKMBINDEX, TROPONINI in the last 168 hours. BNP (last 3 results) No results for input(s): PROBNP in the last 8760 hours. HbA1C: No results for input(s): HGBA1C in the last 72 hours. CBG: No results for input(s): GLUCAP in the last 168 hours. Lipid Profile: No results for input(s): CHOL, HDL, LDLCALC, TRIG, CHOLHDL, LDLDIRECT in the last 72 hours. Thyroid Function Tests: No results for input(s): TSH, T4TOTAL, FREET4, T3FREE, THYROIDAB in the last 72 hours. Anemia Panel: No results for input(s): VITAMINB12, FOLATE, FERRITIN, TIBC, IRON, RETICCTPCT in the last 72 hours. Urine analysis:    Component Value Date/Time   COLORURINE Yellow 09/02/2013 0055   APPEARANCEUR Hazy 09/02/2013 0055   LABSPEC 1.010 09/02/2013 0055   PHURINE 6.0 09/02/2013 0055   GLUCOSEU 50 mg/dL 09/02/2013 0055   HGBUR 1+ 09/02/2013 0055   BILIRUBINUR Negative 09/02/2013 0055   KETONESUR Negative 09/02/2013 0055   PROTEINUR 100 mg/dL 09/02/2013 0055   NITRITE Negative 09/02/2013 0055   LEUKOCYTESUR Trace 09/02/2013 0055   Sepsis Labs: @LABRCNTIP (procalcitonin:4,lacticidven:4) ) Recent Results (from the past 240 hour(s))  SARS Coronavirus 2 by RT PCR (hospital order, performed in [facility] hospital lab) Nasopharyngeal Nasopharyngeal Swab     Status: None   Collection Time: 08/01/19  2:31 PM   Specimen: Nasopharyngeal Swab  Result Value Ref Range Status   SARS Coronavirus 2 NEGATIVE NEGATIVE Final    Comment: (NOTE) SARS-CoV-2 target nucleic acids are NOT DETECTED.  The SARS-CoV-2 RNA is generally detectable in upper and lower respiratory specimens during the acute phase of infection. The lowest concentration of SARS-CoV-2 viral copies this assay can detect is 250 copies / mL. A  negative result does not preclude SARS-CoV-2 infection and should not be used as the sole basis for treatment or other patient management decisions.  A negative result may occur with improper specimen collection / handling, submission of specimen other than nasopharyngeal swab, presence of viral mutation(s) within the areas targeted by this assay, and inadequate number of viral copies (<250 copies / mL). A negative result must be combined with clinical observations, patient history, and epidemiological information.  Fact Sheet for Patients:   StrictlyIdeas.no  Fact Sheet for Healthcare Providers: BankingDealers.co.za  This test is not yet approved or  cleared by the [place] FDA and has been authorized for detection and/or diagnosis of SARS-CoV-2 by FDA under an Emergency Use Authorization (EUA).  This EUA will remain in effect (meaning this test can be used) for the duration of the COVID-19 declaration under Section 564(b)(1) of the Act, 21 U.S.C. section 360bbb-3(b)(1), unless the authorization is terminated or revoked sooner.  Performed at [facility], [address]  [place], [place] [address]      Radiological Exams on Admission: CT ABDOMEN PELVIS WO CONTRAST  Result Date: 08/01/2019 CLINICAL DATA:  Severe pain EXAM: CT ABDOMEN AND PELVIS WITHOUT CONTRAST TECHNIQUE: Multidetector CT imaging of the abdomen and pelvis was performed following the standard protocol without IV contrast. COMPARISON:  September 02, 2013. FINDINGS: Lower chest: The visualized heart size within normal limits. No pericardial fluid/thickening. There is a moderate to large paraesophageal hernia present. A right effusion is present. Hepatobiliary: The liver is normal in density without focal abnormality.The main portal vein is patent. The patient is status post cholecystectomy. No biliary ductal dilation. Pancreas: Unremarkable. No pancreatic ductal  dilatation or surrounding inflammatory changes. Spleen: Normal in size without focal abnormality. Adrenals/Urinary Tract: Both adrenal glands appear normal. The kidneys and collecting system appear normal without evidence of urinary tract calculus or hydronephrosis. Bladder is unremarkable. Stomach/Bowel: A moderate to large paraesophageal hernia is noted. There is moderately dilated air loops measuring to 3.3 cm to the distal ileum. The terminal ileum appears to be decompressed there appears to be question thickening with surrounding fat stranding changes seen around the cecum. There is air and stool seen throughout the remainder of the colon to the rectum. No pericolonic free free air is. Vascular/Lymphatic: There are no enlarged mesenteric, retroperitoneal, or pelvic lymph nodes. Scattered aortic atherosclerotic calcifications are seen without aneurysmal dilatation. Reproductive: The patient is status post hysterectomy. No adnexal masses or collections seen. Other: No evidence of abdominal wall mass or hernia. Musculoskeletal: No acute or significant osseous findings. There is a levoconvex scoliotic curvature of the lumbar. Chronic superior compression deformity of the L1 vertebral bodies seen than 5% height. IMPRESSION: 1. Findings consistent with a partial small obstruction to the level of the terminal ileum which appears to be decompressed. There is wall thickening with stranding changes the cecum. Non-specific could be due to colitis however cannot underlying obstructing lesion. Upon resolution of symptoms would recommend direct visualization. 2. Moderate to large paraesophageal hernia 3.  Aortic Atherosclerosis (ICD10-I70.0). 4. Small right effusion Electronically Signed   By: Prudencio Pair M.D.   On: 08/01/2019 16:50    EKG: Independently reviewed.  Sinus tachycardia with LBBB.  Assessment/Plan Principal Problem:   Partial small bowel obstruction (HCC) Active Problems:   Essential hypertension    SBO (small bowel obstruction) ([place])    1. Partial small bowel obstruction -General surgery was notified by the ER physician.  NG tube was attempted but was unable to be placed.  We will keep patient n.p.o. IV fluids pain relief medications repeat KUB and follow general surgery consult. 2. Acute on chronic kidney disease stage III likely from vomiting.  Patient is getting gentle hydration follow metabolic panel intake output. 3. Hypertension we will keep patient on as needed IV hydralazine.  Follow blood pressure trends. 4. Elevated lactic acid level likely from dehydration.  Does not appear to be septic.  Closely monitor.  CT scan did not show any definite evidence of any ischemic bowel at this time.  And lactic acid is also clearing with fluids.  Will await general surgery input. 5. Hyperglycemia check hemoglobin A1c. 6. Normocytic normochromic anemia appears to be chronic.  Follow CBC. 7. History of breast cancer in remission. 8. History of restless leg syndrome presently n.p.o.  Since patient has small bowel obstruction will need close monitoring for any further worsening in inpatient status.   DVT prophylaxis: SCDs for now.  Avoiding anticoagulation in anticipation of possible procedure. Code Status:  Full code. Family Communication: Discussed with patient. Disposition Plan: Home. Consults called: General surgery. Admission status: Inpatient.   Rise Patience MD [facility] Pager [phone].  If 7PM-7AM, please contact night-coverage [email or web address] Password [facility]  08/01/2019, 11:52 PM

## 2019-08-01 NOTE — ED Provider Notes (Signed)
[facility] EMERGENCY DEPARTMENT Provider Note   CSN: [id number] Arrival date & time: 08/01/19  1342     History Chief Complaint  Patient presents with  . Abdominal Pain    Amber Hunter is a 86 y.o. female.  The history is provided by the patient, a relative and medical records. No language interpreter was used.  Abdominal Pain Pain location:  Generalized Pain quality: aching, bloating and sharp   Pain radiates to:  Does not radiate Pain severity:  Severe Onset quality:  Gradual Duration:  3 days Timing:  Constant Progression:  Worsening Chronicity:  New Relieved by:  Nothing Worsened by:  Palpation and eating Ineffective treatments:  None tried Associated symptoms: anorexia, belching, chills, constipation, fatigue, nausea and vomiting   Associated symptoms: no chest pain, no cough, no diarrhea, no dysuria, no fever and no shortness of breath   Risk factors: being elderly   Risk factors: has not had multiple surgeries        Past Medical History:  Diagnosis Date  . Breast cancer ([place])   . Hypertension     There are no problems to display for this patient.   Past Surgical History:  Procedure Laterality Date  . BREAST LUMPECTOMY       OB History   No obstetric history on file.     No family history on file.  Social History   Tobacco Use  . Smoking status: Never Smoker  . Smokeless tobacco: Never Used  Vaping Use  . Vaping Use: Never used  Substance Use Topics  . Alcohol use: Never  . Drug use: Never    Home Medications Prior to Admission medications   Medication Sig Start Date End Date Taking? Authorizing Provider  amoxicillin-clavulanate (AUGMENTIN) 500-125 MG tablet Take 1 tablet (500 mg total) by mouth every 8 (eight) hours. 01/15/18   Veryl Speak, MD  citalopram (CELEXA) 10 MG tablet Take 10 mg by mouth daily.    [provider]  lisinopril-hydrochlorothiazide (PRINZIDE,ZESTORETIC) 10-12.5 MG tablet Take 1 tablet by  mouth daily.    [provider]  pramipexole (MIRAPEX) 0.125 MG tablet Take 0.125 mg by mouth at bedtime.    [provider]  traMADol (ULTRAM) 50 MG tablet Take by mouth every 6 (six) hours as needed.    [provider]  traZODone (DESYREL) 50 MG tablet Take 50 mg by mouth at bedtime.    [provider]    Allergies    Patient has no known allergies.  Review of Systems   Review of Systems  Constitutional: Positive for chills and fatigue. Negative for diaphoresis and fever.  HENT: Negative for congestion.   Respiratory: Negative for cough, chest tightness, shortness of breath and wheezing.   Cardiovascular: Negative for chest pain, palpitations and leg swelling.  Gastrointestinal: Positive for abdominal distention, abdominal pain, anorexia, constipation, nausea and vomiting. Negative for diarrhea.  Genitourinary: Negative for dysuria, flank pain and frequency.  Musculoskeletal: Negative for back pain, neck pain and neck stiffness.  Neurological: Negative for headaches.  Psychiatric/Behavioral: Negative for agitation.  All other systems reviewed and are negative.   Physical Exam Updated Vital Signs BP 112/80 (BP Location: Right Arm)   Pulse (!) 130   Temp 98.3 F (36.8 C) (Oral)   Resp 16   Ht 5\' 3"  (1.6 m)   Wt 64.4 kg   SpO2 96%   BMI 25.15 kg/m   Physical Exam Vitals and nursing note reviewed.  Constitutional:  General: She is not in acute distress.    Appearance: She is well-developed. She is not ill-appearing, toxic-appearing or diaphoretic.  HENT:     Head: Normocephalic and atraumatic.  Eyes:     Extraocular Movements: Extraocular movements intact.     Conjunctiva/sclera: Conjunctivae normal.     Pupils: Pupils are equal, round, and reactive to light.  Cardiovascular:     Rate and Rhythm: Regular rhythm. Tachycardia present.     Heart sounds: Normal heart sounds. No murmur heard.   Pulmonary:     Effort: Pulmonary  effort is normal. No respiratory distress.     Breath sounds: Normal breath sounds. No wheezing, rhonchi or rales.  Chest:     Chest wall: No tenderness.  Abdominal:     General: Bowel sounds are increased. There is distension.     Palpations: Abdomen is soft.     Tenderness: There is generalized abdominal tenderness. There is no right CVA tenderness, left CVA tenderness, guarding or rebound.  Musculoskeletal:     Cervical back: Neck supple.  Skin:    General: Skin is warm and dry.     Capillary Refill: Capillary refill takes less than 2 seconds.  Neurological:     General: No focal deficit present.     Mental Status: She is alert.     ED Results / Procedures / Treatments   Labs (all labs ordered are listed, but only abnormal results are displayed) Labs Reviewed  COMPREHENSIVE METABOLIC PANEL - Abnormal; Notable for the following components:      Result Value   Chloride 95 (*)    Glucose, Bld 214 (*)    BUN 53 (*)    Creatinine, Ser 1.60 (*)    GFR calc non Af Amer 28 (*)    GFR calc Af Amer 33 (*)    Anion gap 18 (*)    All other components within normal limits  CBC - Abnormal; Notable for the following components:   WBC 11.4 (*)    Hemoglobin 10.4 (*)    HCT 34.3 (*)    MCH 25.4 (*)    Platelets 510 (*)    All other components within normal limits  LACTIC ACID, PLASMA - Abnormal; Notable for the following components:   Lactic Acid, Venous 5.0 (*)    All other components within normal limits  URINE CULTURE  SARS CORONAVIRUS 2 BY RT PCR (HOSPITAL ORDER, [facility])  LIPASE, BLOOD  URINALYSIS, ROUTINE W REFLEX MICROSCOPIC  LACTIC ACID, PLASMA    EKG EKG Interpretation  Date/Time:  Wednesday August 01 2019 14:40:07 EDT Ventricular Rate:  105 PR Interval:    QRS Duration: 133 QT Interval:  368 QTC Calculation: 487 R Axis:   -12 Text Interpretation: Sinus tachycardia Atrial premature complex Left bundle branch block when comapred to  priorl, new LBBB. No STEMI Confirmed by Antony Blackbird [phone]) on 08/01/2019 3:08:46 PM   Radiology No results found.  Procedures Procedures (including critical care time)  Medications Ordered in ED Medications  sodium chloride flush (NS) 0.9 % injection 3 mL (has no administration in time range)  fentaNYL (SUBLIMAZE) injection 50 mcg (50 mcg Intravenous Given 08/01/19 1440)  ondansetron (ZOFRAN) injection 4 mg (4 mg Intravenous Given 08/01/19 1441)  sodium chloride 0.9 % bolus 500 mL (0 mLs Intravenous Stopped 08/01/19 1514)    ED Course  I have reviewed the triage vital signs and the nursing notes.  Pertinent labs & imaging results that were  available during my care of the patient were reviewed by me and considered in my medical decision making (see chart for details).    MDM Rules/Calculators/A&P                          Blimy Napoleon is a 86 y.o. female with a past medical history significant for hypertension, prior breast cancer, and diverticulitis who presents with abdominal pain, distention, nausea, vomiting, decreased bowel movements, decreased appetite, and malaise.  According to family, patient has not eaten well for the last few days.  She has had abdominal pain that is an 8 out of 10 in severity that is in the diffuse abdomen.  She reports no trauma.  She reports nausea and vomiting and has been feeling some fatigue.  She denies any urinary changes.  She reports that she has had diverticulitis in the past and this feels somewhat similar.  She has not had a bowel movement several days but is still passing some gas.  She denies any history of obstructions.  She reports no diarrhea.  She denies any fevers but does report occasional chills.  She denies any URI symptoms, congestion, cough.   On exam, patient is tachycardic with a rate in the 130 range.  She is not hypotensive, febrile, or tachypneic.  She is resting comfortably.  Patient had tender diffuse abdomen with increased bowel  sounds.  Back and chest nontender.  Lungs clear.  No murmur.  Good pulses in extremities.  Clinical I am concerned about bowel obstruction versus other concerning etiology.  Diverticulitis also considered given her history of the same.  We will get screening labs, pain medicine, nausea medicine, give her some fluids.  We will get a CT abdomen pelvis to evaluate.  Nursing reports that the creatinine returned elevated and she will need a noncontrasted CT scan to evaluate.  She will get the oral contrast.  Anticipate reassessment after imaging and work-up.  Anticipate admission given the patient's age and significant abdominal pain with tachycardia.  Care transferred to Dr. Stark Jock while waiting for imaging results.    Final Clinical Impression(s) / ED Diagnoses Final diagnoses:  Generalized abdominal pain  Non-intractable vomiting with nausea, unspecified vomiting type  Abdominal distension    Clinical Impression: 1. Generalized abdominal pain   2. Non-intractable vomiting with nausea, unspecified vomiting type   3. Abdominal distension     Disposition: Care transferred to Dr. Stark Jock while waiting for imaging results.  This note was prepared with assistance of Systems analyst. Occasional wrong-word or sound-a-like substitutions may have occurred due to the inherent limitations of voice recognition software.     Lamont Glasscock, Gwenyth Allegra, MD 08/01/19 1530

## 2019-08-01 NOTE — ED Notes (Signed)
Attempted to call report to floor, RN Hailey to call back in 5 mins

## 2019-08-01 NOTE — ED Notes (Signed)
Pt vomited following drinking contrast drink.  Dr Sherry Ruffing made aware, approved addition dose of Zofran 4mg .  Recommended CT scan without further po contrast.

## 2019-08-01 NOTE — ED Triage Notes (Signed)
C/o abd pain, n/v/constipation, decreased appetite x 3 days-no relief with OTC stool softener/lax-NAD-to triage in w/c

## 2019-08-01 NOTE — ED Notes (Signed)
Pt reports some improvement in pain following medication.  Denies nausea, per Dr Sherry Ruffing, to complete full liter NS due to elevated lactic acid.

## 2019-08-02 ENCOUNTER — Inpatient Hospital Stay (HOSPITAL_COMMUNITY): Payer: Medicare Other

## 2019-08-02 LAB — BASIC METABOLIC PANEL
Anion gap: 12 (ref 5–15)
BUN: 52 mg/dL — ABNORMAL HIGH (ref 8–23)
CO2: 30 mmol/L (ref 22–32)
Calcium: 8.9 mg/dL (ref 8.9–10.3)
Chloride: 98 mmol/L (ref 98–111)
Creatinine, Ser: 1.48 mg/dL — ABNORMAL HIGH (ref 0.44–1.00)
GFR calc Af Amer: 36 mL/min — ABNORMAL LOW (ref >60)
GFR calc non Af Amer: 31 mL/min — ABNORMAL LOW (ref >60)
Glucose, Bld: 141 mg/dL — ABNORMAL HIGH (ref 70–99)
Potassium: 4 mmol/L (ref 3.5–5.1)
Sodium: 140 mmol/L (ref 135–145)

## 2019-08-02 LAB — GLUCOSE, CAPILLARY
Glucose-Capillary: 129 mg/dL — ABNORMAL HIGH (ref 70–99)
Glucose-Capillary: 121 mg/dL — ABNORMAL HIGH (ref 70–99)
Glucose-Capillary: 128 mg/dL — ABNORMAL HIGH (ref 70–99)

## 2019-08-02 LAB — LACTIC ACID, PLASMA
Lactic Acid, Venous: 0.9 mmol/L (ref 0.5–1.9)
Lactic Acid, Venous: 1.1 mmol/L (ref 0.5–1.9)

## 2019-08-02 LAB — HEMOGLOBIN A1C
Hgb A1c MFr Bld: 5.8 % — ABNORMAL HIGH (ref 4.8–5.6)
Mean Plasma Glucose: 119.76 mg/dL

## 2019-08-02 LAB — CBC
HCT: 31.4 % — ABNORMAL LOW (ref 36.0–46.0)
Hemoglobin: 9.5 g/dL — ABNORMAL LOW (ref 12.0–15.0)
MCH: 25.4 pg — ABNORMAL LOW (ref 26.0–34.0)
MCHC: 30.3 g/dL (ref 30.0–36.0)
MCV: 84 fL (ref 80.0–100.0)
Platelets: 423 10*3/uL — ABNORMAL HIGH (ref 150–400)
RBC: 3.74 MIL/uL — ABNORMAL LOW (ref 3.87–5.11)
RDW: 14.3 % (ref 11.5–15.5)
WBC: 9.2 10*3/uL (ref 4.0–10.5)
nRBC: 0 % (ref 0.0–0.2)

## 2019-08-02 LAB — HEPATIC FUNCTION PANEL
ALT: 10 U/L (ref 0–44)
AST: 12 U/L — ABNORMAL LOW (ref 15–41)
Albumin: 3.4 g/dL — ABNORMAL LOW (ref 3.5–5.0)
Alkaline Phosphatase: 63 U/L (ref 38–126)
Bilirubin, Direct: 0.1 mg/dL (ref 0.0–0.2)
Indirect Bilirubin: 0.6 mg/dL (ref 0.3–0.9)
Total Bilirubin: 0.7 mg/dL (ref 0.3–1.2)
Total Protein: 7.2 g/dL (ref 6.5–8.1)

## 2019-08-02 MED ORDER — SODIUM CHLORIDE 0.9 % IV SOLN: INTRAVENOUS | Status: AC

## 2019-08-02 MED ORDER — DIATRIZOATE MEGLUMINE & SODIUM 66-10 % PO SOLN
90.0000 mL | Freq: Once | ORAL | Status: AC
  Administered 2019-08-02: 90 mL via NASOGASTRIC
  Filled 2019-08-02: qty 90

## 2019-08-02 MED ORDER — MORPHINE SULFATE (PF) 2 MG/ML IV SOLN
1.0000 mg | INTRAVENOUS | Status: DC | PRN
  Administered 2019-08-02 – 2019-08-06 (×7): 1 mg via INTRAVENOUS
  Filled 2019-08-02 (×7): qty 1

## 2019-08-02 MED ORDER — PHENOL 1.4 % MT LIQD
1.0000 | OROMUCOSAL | Status: DC | PRN
  Filled 2019-08-02 (×2): qty 177

## 2019-08-02 MED ORDER — HEPARIN SODIUM (PORCINE) 5000 UNIT/ML IJ SOLN
5000.0000 [IU] | Freq: Three times a day (TID) | INTRAMUSCULAR | Status: DC
  Administered 2019-08-02 – 2019-08-13 (×33): 5000 [IU] via SUBCUTANEOUS
  Filled 2019-08-02 (×33): qty 1

## 2019-08-02 MED ORDER — LIP MEDEX EX OINT
TOPICAL_OINTMENT | CUTANEOUS | Status: AC
  Administered 2019-08-02: 1
  Filled 2019-08-02: qty 7

## 2019-08-02 NOTE — Progress Notes (Signed)
PROGRESS NOTE    Amber Hunter  GUR:[id number] DOB: Dec 26, 1928 DOA: 08/01/2019 PCP: Delrae Alfred, DO   Chef Complaints: Nausea vomiting abdominal pain x3 days  Brief Narrative: 86 y.o. female with history of hypertension, chronic kidney disease stage III, breast cancer in remission, restless leg syndrome, chronic pain on tramadol presents to the ER at [facility] with complaints of nausea vomiting abdominal pain for last 3 days.  Has not moved her bowels for the same time.  Pain is generalized and constant.  Denies fever chills chest pain or shortness of breath.  Denies any blood in the vomitus.  ED Course: In the ER CT abdomen pelvis shows features concerning for partial small bowel obstruction.  On-call general surgeon was consulted and patient admitted for further management of partial small bowel obstruction.  Labs are significant for elevated lactic acid which improved with fluids.  Also seen is hyperglycemia elevated creatinine hemoglobin of 10.4 mild leukocytosis Covid test was negative.  EKG was showing sinus tachycardia with LBBB   Subjective:  c/o RLQ abdominal pain, not in distress, no nausea or vomting Passing flatus.reports ate couple of days ago and had BM couple of days ago. Came her because " I got sick" Lives with son uses cane to walk [place] and [place] with ivf running  Assessment & Plan:  Partial small bowel obstruction: Keep n.p.o., IV fluids, pain management.NGT attempted at [facility] and was unsuccessful. KUB this morning shows persistent small bowel obstruction bowel gas pattern. surgery consulted this morning.  Essential hypertension lisinopril HCTZ at home: BP stable.  Continue to hold p.o. meds, can use IV as needed meds if needed.  AKI on CKD IIIa : Last BUN/creatinine in system was 11/1.0 in August/17/2015 no other labs.  On admission 1.6 and downtrending.suspect AKI in the setting of #1/prerenal, will keep on gentle IV hydration hold HCTZ  lisinopril. Recent Labs  Lab 08/01/19 1431 08/02/19 0027  BUN 53* 52*  CREATININE 1.60* 1.48*   Lactic acidosis up to 5.0 likely from volume depletion and bowel obstruction, lactic acidosis has resolved > 0.9.  Hyperglycemia. likely from stress. Prediabetes on hba1c.  Hemoglobin a1c at 5.8.  Monitor intermittently.  Normocytic anemia: Globin at 9 to 10 g.  Monitor closely.  Hx of breast ca in remission  RLS hx p.o. meds on hold  DVT prophylaxis: heparin injection 5,000 Units Start: 08/02/19 1100 SCDs Start: 08/01/19 2349.  We will need to hold DVT prophylaxis if surgery is planned Code Status: Full code Family Communication: plan of care discussed with patient at bedside.  Status is: Inpatient  Remains inpatient appropriate because:IV treatments appropriate due to intensity of illness or inability to take PO and Inpatient level of care appropriate due to severity of illness Remains hospitalized due to ongoing small bowel obstruction  Dispo: The patient is from: Home              Anticipated d/c is to: Home              Anticipated d/c date is: 3 days              Patient currently is not medically stable to d/c.        Nutrition: Diet Order            Diet NPO time specified  Diet effective now                       Body mass  index is 25.15 kg/m. Pressure Ulcer:    Consultants:see note  Procedures:see note Microbiology:see note  Medications: Scheduled Meds: . diatrizoate meglumine-sodium  90 mL Per NG tube Once  . heparin injection (subcutaneous)  5,000 Units Subcutaneous Q8H  . sodium chloride flush  3 mL Intravenous Once   Continuous Infusions: . sodium chloride 75 mL/hr at 08/02/19 0101    Antimicrobials: Anti-infectives (From admission, onward)   None       Objective: Vitals: Today's Vitals   08/01/19 2229 08/02/19 0154 08/02/19 0615 08/02/19 0835  BP: (!) 142/80 (!) 144/81 (!) 142/80 136/70  Pulse: (!) 105 (!) 106 (!) 102 (!) 101   Resp: 14 18 18 17   Temp: 98.2 F (36.8 C) 98.3 F (36.8 C) 98.5 F (36.9 C) 98.5 F (36.9 C)  TempSrc: Oral Oral Oral Oral  SpO2: 97% 100% 100% 97%  Weight:      Height:      PainSc:        Intake/Output Summary (Last 24 hours) at 08/02/2019 1059 Last data filed at 08/02/2019 1000 Gross per 24 hour  Intake 500 ml  Output 0 ml  Net 500 ml   Filed Weights   08/01/19 1405  Weight: 64.4 kg   Weight change:    Intake/Output from previous day: 07/14 0701 - 07/15 0700 In: 500 [IV Piggyback:500] Out: 0  Intake/Output this shift: No intake/output data recorded.  Examination:  General exam: AAOx3 ,NAD, weak appearing. HEENT:Oral mucosa moist, Ear/Nose WNL grossly,dentition normal. Respiratory system: bilaterally clear,no wheezing or crackles,no use of accessory muscle, non tender. Cardiovascular system: S1 & S2 +, regular, No JVD. Gastrointestinal system: Abdomen soft, mildly tender,ND,BS+. Nervous System:Alert, awake, moving extremities and grossly nonfocal Extremities: No edema, distal peripheral pulses palpable.  Skin: No rashes,no icterus. MSK: Normal muscle bulk,tone, power  Data Reviewed: I have personally reviewed following labs and imaging studies CBC: Recent Labs  Lab 08/01/19 1431 08/02/19 0027  WBC 11.4* 9.2  HGB 10.4* 9.5*  HCT 34.3* 31.4*  MCV 83.7 84.0  PLT 510* 803*   Basic Metabolic Panel: Recent Labs  Lab 08/01/19 1431 08/02/19 0027  NA 136 140  K 3.7 4.0  CL 95* 98  CO2 23 30  GLUCOSE 214* 141*  BUN 53* 52*  CREATININE 1.60* 1.48*  CALCIUM 9.2 8.9   GFR: Estimated Creatinine Clearance: 23.3 mL/min (A) (by C-G formula based on SCr of 1.48 mg/dL (H)). Liver Function Tests: Recent Labs  Lab 08/01/19 1431 08/02/19 0027  AST 20 12*  ALT 10 10  ALKPHOS 69 63  BILITOT 0.5 0.7  PROT 7.7 7.2  ALBUMIN 3.6 3.4*   Recent Labs  Lab 08/01/19 1431  LIPASE 17   No results for input(s): AMMONIA in the last 168 hours. Coagulation  Profile: No results for input(s): INR, PROTIME in the last 168 hours. Cardiac Enzymes: No results for input(s): CKTOTAL, CKMB, CKMBINDEX, TROPONINI in the last 168 hours. BNP (last 3 results) No results for input(s): PROBNP in the last 8760 hours. HbA1C: Recent Labs    08/02/19 0027  HGBA1C 5.8*   CBG: Recent Labs  Lab 08/02/19 0130 08/02/19 0743  GLUCAP 129* 121*   Lipid Profile: No results for input(s): CHOL, HDL, LDLCALC, TRIG, CHOLHDL, LDLDIRECT in the last 72 hours. Thyroid Function Tests: No results for input(s): TSH, T4TOTAL, FREET4, T3FREE, THYROIDAB in the last 72 hours. Anemia Panel: No results for input(s): VITAMINB12, FOLATE, FERRITIN, TIBC, IRON, RETICCTPCT in the last 72 hours. Sepsis Labs: Recent Labs  Lab 08/01/19 1431 08/01/19 1649 08/02/19 0027 08/02/19 0424  LATICACIDVEN 5.0* 2.4* 1.1 0.9    Recent Results (from the past 240 hour(s))  SARS Coronavirus 2 by RT PCR (hospital order, performed in [facility] hospital lab) Nasopharyngeal Nasopharyngeal Swab     Status: None   Collection Time: 08/01/19  2:31 PM   Specimen: Nasopharyngeal Swab  Result Value Ref Range Status   SARS Coronavirus 2 NEGATIVE NEGATIVE Final    Comment: (NOTE) SARS-CoV-2 target nucleic acids are NOT DETECTED.  The SARS-CoV-2 RNA is generally detectable in upper and lower respiratory specimens during the acute phase of infection. The lowest concentration of SARS-CoV-2 viral copies this assay can detect is 250 copies / mL. A negative result does not preclude SARS-CoV-2 infection and should not be used as the sole basis for treatment or other patient management decisions.  A negative result may occur with improper specimen collection / handling, submission of specimen other than nasopharyngeal swab, presence of viral mutation(s) within the areas targeted by this assay, and inadequate number of viral copies (<250 copies / mL). A negative result must be combined with  clinical observations, patient history, and epidemiological information.  Fact Sheet for Patients:   StrictlyIdeas.no  Fact Sheet for Healthcare Providers: BankingDealers.co.za  This test is not yet approved or  cleared by the [place] FDA and has been authorized for detection and/or diagnosis of SARS-CoV-2 by FDA under an Emergency Use Authorization (EUA).  This EUA will remain in effect (meaning this test can be used) for the duration of the COVID-19 declaration under Section 564(b)(1) of the Act, 21 U.S.C. section 360bbb-3(b)(1), unless the authorization is terminated or revoked sooner.  Performed at [facility], [address] [place], [place] [address]       Radiology Studies: CT ABDOMEN PELVIS WO CONTRAST  Result Date: 08/01/2019 CLINICAL DATA:  Severe pain EXAM: CT ABDOMEN AND PELVIS WITHOUT CONTRAST TECHNIQUE: Multidetector CT imaging of the abdomen and pelvis was performed following the standard protocol without IV contrast. COMPARISON:  September 02, 2013. FINDINGS: Lower chest: The visualized heart size within normal limits. No pericardial fluid/thickening. There is a moderate to large paraesophageal hernia present. A right effusion is present. Hepatobiliary: The liver is normal in density without focal abnormality.The main portal vein is patent. The patient is status post cholecystectomy. No biliary ductal dilation. Pancreas: Unremarkable. No pancreatic ductal dilatation or surrounding inflammatory changes. Spleen: Normal in size without focal abnormality. Adrenals/Urinary Tract: Both adrenal glands appear normal. The kidneys and collecting system appear normal without evidence of urinary tract calculus or hydronephrosis. Bladder is unremarkable. Stomach/Bowel: A moderate to large paraesophageal hernia is noted. There is moderately dilated air loops measuring to 3.3 cm to the distal ileum. The terminal ileum appears to  be decompressed there appears to be question thickening with surrounding fat stranding changes seen around the cecum. There is air and stool seen throughout the remainder of the colon to the rectum. No pericolonic free free air is. Vascular/Lymphatic: There are no enlarged mesenteric, retroperitoneal, or pelvic lymph nodes. Scattered aortic atherosclerotic calcifications are seen without aneurysmal dilatation. Reproductive: The patient is status post hysterectomy. No adnexal masses or collections seen. Other: No evidence of abdominal wall mass or hernia. Musculoskeletal: No acute or significant osseous findings. There is a levoconvex scoliotic curvature of the lumbar. Chronic superior compression deformity of the L1 vertebral bodies seen than 5% height. IMPRESSION: 1. Findings consistent with a partial small obstruction to the level of  the terminal ileum which appears to be decompressed. There is wall thickening with stranding changes the cecum. Non-specific could be due to colitis however cannot underlying obstructing lesion. Upon resolution of symptoms would recommend direct visualization. 2. Moderate to large paraesophageal hernia 3.  Aortic Atherosclerosis (ICD10-I70.0). 4. Small right effusion Electronically Signed   By: Prudencio Pair M.D.   On: 08/01/2019 16:50   DG Abd 1 View  Result Date: 08/02/2019 CLINICAL DATA:  Small-bowel obstruction follow-up. EXAM: ABDOMEN - 1 VIEW COMPARISON:  CT scan 08/01/2019 FINDINGS: Persistent dilated air-filled loops of small bowel consistent with small-bowel obstruction. Some residual stool in the rectosigmoid area. No free air is identified. IMPRESSION: Persistent small-bowel obstruction bowel gas pattern. Electronically Signed   By: Marijo Sanes M.D.   On: 08/02/2019 07:03     LOS: 1 day   Antonieta Pert, MD [facility]  08/02/2019, 10:59 AM

## 2019-08-02 NOTE — Consult Note (Signed)
Amber Hunter [birth year]  [id number].    Requesting MD: Dr. Antonieta Pert Chief Complaint/Reason for Consult: SBO  HPI:  This is an 86 yo white female with a history of HTN, CKD stage III, RLS, breast cancer, and chronic pain on tramadol.  She states about 4 days ago she developed some abdominal pain, but denies any N/V.  She passed some flatus within the last couple of days, but hasn't had a BM since earlier this week.  She denies any fevers, chest pain, SOB, chills, etc.  She lives at home with her son and daughter in law and uses a cane to mobilize.  She doesn't give much more history than that.  She presented to [facility] where she underwent a CT scan of her A/P.  She has been found to have a SBO.  She has a history of a lower midline appy and hysterectomy.  She was transferred here to [facility] where we have been asked to see her.  Her labs are relatively unremarkable.  ROS: ROS: Please see HPI, otherwise all other systems have been reviewed and are negative.  Family History  Family history unknown: Yes    Past Medical History:  Diagnosis Date  . Breast cancer ([place])   . Hypertension     Past Surgical History:  Procedure Laterality Date  . BREAST LUMPECTOMY      Social History:  reports that she has never smoked. She has never used smokeless tobacco. She reports that she does not drink alcohol and does not use drugs.  Allergies: No Known Allergies  Medications Prior to Admission  Medication Sig Dispense Refill  . citalopram (CELEXA) 10 MG tablet Take 10 mg by mouth daily.    Marland Kitchen gabapentin (NEURONTIN) 300 MG capsule Take 300 mg by mouth daily.    Marland Kitchen lisinopril-hydrochlorothiazide (PRINZIDE,ZESTORETIC) 10-12.5 MG tablet Take 1 tablet by mouth daily.    . pramipexole (MIRAPEX) 0.125 MG tablet Take 0.125 mg by mouth at bedtime.    . traMADol (ULTRAM) 50 MG tablet Take 50 mg by mouth every 6 (six) hours as needed for moderate pain.     . traZODone (DESYREL) 50 MG tablet Take 50 mg by mouth  at bedtime.    Marland Kitchen amoxicillin-clavulanate (AUGMENTIN) 500-125 MG tablet Take 1 tablet (500 mg total) by mouth every 8 (eight) hours. (Patient not taking: Reported on 08/01/2019) 21 tablet 0     Physical Exam: Blood pressure 136/70, pulse (!) 101, temperature 98.5 F (36.9 C), temperature source Oral, resp. rate 17, height 5\' 3"  (1.6 m), weight 64.4 kg, SpO2 97 %. General: pleasant, elderly white female who is laying in bed in NAD HEENT: head is normocephalic, atraumatic.  Sclera are noninjected.  PERRL.  Ears and nose without any masses or lesions.  Mouth is pink and moist Heart: regular, rate, and rhythm, slightly tachy.  Normal s1,s2. No obvious gallops, or rubs noted. +murmur.  Palpable radial and pedal pulses bilaterally Lungs: CTAB, no wheezes, rhonchi, or rales noted.  Respiratory effort nonlabored Abd: soft, NT, mild distention, +BS, no masses, hernias, or organomegaly. Lower midline scar noted MS: all 4 extremities are symmetrical with no cyanosis, clubbing, or edema. Skin: warm and dry with no masses, lesions, or rashes Neuro: Cranial nerves 2-12 grossly intact, sensation is normal throughout Psych: A&Ox3 with an appropriate affect.   Results for orders placed or performed during the hospital encounter of 08/01/19 (from the past 48 hour(s))  Lipase, blood     Status: None  Collection Time: 08/01/19  2:31 PM  Result Value Ref Range   Lipase 17 11 - 51 U/L    Comment: Performed at [facility], [address] [place], [place] [address]  Comprehensive metabolic panel     Status: Abnormal   Collection Time: 08/01/19  2:31 PM  Result Value Ref Range   Sodium 136 135 - 145 mmol/L   Potassium 3.7 3.5 - 5.1 mmol/L   Chloride 95 (L) 98 - 111 mmol/L   CO2 23 22 - 32 mmol/L   Glucose, Bld 214 (H) 70 - 99 mg/dL    Comment: Glucose reference range applies only to samples taken after fasting for at least 8 hours.   BUN 53 (H) 8 - 23 mg/dL   Creatinine, Ser 1.60 (H) 0.44 -  1.00 mg/dL   Calcium 9.2 8.9 - 10.3 mg/dL   Total Protein 7.7 6.5 - 8.1 g/dL   Albumin 3.6 3.5 - 5.0 g/dL   AST 20 15 - 41 U/L   ALT 10 0 - 44 U/L   Alkaline Phosphatase 69 38 - 126 U/L   Total Bilirubin 0.5 0.3 - 1.2 mg/dL   GFR calc non Af Amer 28 (L) >60 mL/min   GFR calc Af Amer 33 (L) >60 mL/min   Anion gap 18 (H) 5 - 15    Comment: Performed at [facility], [address] [place], [place] [address]  CBC     Status: Abnormal   Collection Time: 08/01/19  2:31 PM  Result Value Ref Range   WBC 11.4 (H) 4.0 - 10.5 K/uL   RBC 4.10 3.87 - 5.11 MIL/uL   Hemoglobin 10.4 (L) 12.0 - 15.0 g/dL   HCT 34.3 (L) 36 - 46 %   MCV 83.7 80.0 - 100.0 fL   MCH 25.4 (L) 26.0 - 34.0 pg   MCHC 30.3 30.0 - 36.0 g/dL   RDW 14.1 11.5 - 15.5 %   Platelets 510 (H) 150 - 400 K/uL   nRBC 0.0 0.0 - 0.2 %    Comment: Performed at [facility], [address] [place], [place] [address]  Lactic acid, plasma     Status: Abnormal   Collection Time: 08/01/19  2:31 PM  Result Value Ref Range   Lactic Acid, Venous 5.0 (HH) 0.5 - 1.9 mmol/L    Comment: CRITICAL RESULT CALLED TO, READ BACK BY AND VERIFIED WITH: ADAM BROWN RN @1503  08/01/2019 OLSONM Performed at [facility], [address] [place], [place] [address]   SARS Coronavirus 2 by RT PCR (hospital order, performed in [place] hospital lab) Nasopharyngeal Nasopharyngeal Swab     Status: None   Collection Time: 08/01/19  2:31 PM   Specimen: Nasopharyngeal Swab  Result Value Ref Range   SARS Coronavirus 2 NEGATIVE NEGATIVE    Comment: (NOTE) SARS-CoV-2 target nucleic acids are NOT DETECTED.  The SARS-CoV-2 RNA is generally detectable in upper and lower respiratory specimens during the acute phase of infection. The lowest concentration of SARS-CoV-2 viral copies this assay can detect is 250 copies / mL. A negative result does not preclude SARS-CoV-2 infection and should not be used as the sole basis for treatment  or other patient management decisions.  A negative result may occur with improper specimen collection / handling, submission of specimen other than nasopharyngeal swab, presence of viral mutation(s) within the areas targeted by this assay, and inadequate number of viral copies (<250  copies / mL). A negative result must be combined with clinical observations, patient history, and epidemiological information.  Fact Sheet for Patients:   StrictlyIdeas.no  Fact Sheet for Healthcare Providers: BankingDealers.co.za  This test is not yet approved or  cleared by the [place] FDA and has been authorized for detection and/or diagnosis of SARS-CoV-2 by FDA under an Emergency Use Authorization (EUA).  This EUA will remain in effect (meaning this test can be used) for the duration of the COVID-19 declaration under Section 564(b)(1) of the Act, 21 U.S.C. section 360bbb-3(b)(1), unless the authorization is terminated or revoked sooner.  Performed at [facility], [address] [place], [place] [address]   Lactic acid, plasma     Status: Abnormal   Collection Time: 08/01/19  4:49 PM  Result Value Ref Range   Lactic Acid, Venous 2.4 (HH) 0.5 - 1.9 mmol/L    Comment: CRITICAL RESULT CALLED TO, READ BACK BY AND VERIFIED WITH: ADAM BROWN RN @1721  08/01/2019 OLSONM Performed at [facility], [address] [place], [place] [address]   Basic metabolic panel     Status: Abnormal   Collection Time: 08/02/19 12:27 AM  Result Value Ref Range   Sodium 140 135 - 145 mmol/L   Potassium 4.0 3.5 - 5.1 mmol/L   Chloride 98 98 - 111 mmol/L   CO2 30 22 - 32 mmol/L   Glucose, Bld 141 (H) 70 - 99 mg/dL    Comment: Glucose reference range applies only to samples taken after fasting for at least 8 hours.   BUN 52 (H) 8 - 23 mg/dL   Creatinine, Ser 1.48 (H) 0.44 - 1.00 mg/dL   Calcium 8.9 8.9 - 10.3 mg/dL   GFR calc non Af Amer 31 (L)  >60 mL/min   GFR calc Af Amer 36 (L) >60 mL/min   Anion gap 12 5 - 15    Comment: Performed at [facility], [place] [address] [place], [place] [address]  CBC     Status: Abnormal   Collection Time: 08/02/19 12:27 AM  Result Value Ref Range   WBC 9.2 4.0 - 10.5 K/uL   RBC 3.74 (L) 3.87 - 5.11 MIL/uL   Hemoglobin 9.5 (L) 12.0 - 15.0 g/dL   HCT 31.4 (L) 36 - 46 %   MCV 84.0 80.0 - 100.0 fL   MCH 25.4 (L) 26.0 - 34.0 pg   MCHC 30.3 30.0 - 36.0 g/dL   RDW 14.3 11.5 - 15.5 %   Platelets 423 (H) 150 - 400 K/uL   nRBC 0.0 0.0 - 0.2 %    Comment: Performed at [facility], [place] [address] [place], [place] [address]  Hepatic function panel     Status: Abnormal   Collection Time: 08/02/19 12:27 AM  Result Value Ref Range   Total Protein 7.2 6.5 - 8.1 g/dL   Albumin 3.4 (L) 3.5 - 5.0 g/dL   AST 12 (L) 15 - 41 U/L   ALT 10 0 - 44 U/L   Alkaline Phosphatase 63 38 - 126 U/L   Total Bilirubin 0.7 0.3 - 1.2 mg/dL   Bilirubin, Direct 0.1 0.0 - 0.2 mg/dL   Indirect Bilirubin 0.6 0.3 - 0.9 mg/dL    Comment: Performed at [facility], [place] [address], [place], [place] [address]  Hemoglobin A1c     Status: Abnormal   Collection Time: 08/02/19 12:27 AM  Result Value Ref Range  Hgb A1c MFr Bld 5.8 (H) 4.8 - 5.6 %    Comment: (NOTE) Pre diabetes:          5.7%-6.4%  Diabetes:              >6.4%  Glycemic control for   <7.0% adults with diabetes    Mean Plasma Glucose 119.76 mg/dL    Comment: Performed at [facility], [place] [address] [place], [place] [address]  Lactic acid, plasma     Status: None   Collection Time: 08/02/19 12:27 AM  Result Value Ref Range   Lactic Acid, Venous 1.1 0.5 - 1.9 mmol/L    Comment: Performed at [facility], [place] [address], [place], [place] [address]  Glucose, capillary     Status: Abnormal   Collection Time: 08/02/19  1:30 AM  Result Value Ref Range   Glucose-Capillary 129 (H) 70  - 99 mg/dL    Comment: Glucose reference range applies only to samples taken after fasting for at least 8 hours.  Lactic acid, plasma     Status: None   Collection Time: 08/02/19  4:24 AM  Result Value Ref Range   Lactic Acid, Venous 0.9 0.5 - 1.9 mmol/L    Comment: Performed at [facility], [place] [address] [place], [place] [address]  Glucose, capillary     Status: Abnormal   Collection Time: 08/02/19  7:43 AM  Result Value Ref Range   Glucose-Capillary 121 (H) 70 - 99 mg/dL    Comment: Glucose reference range applies only to samples taken after fasting for at least 8 hours.   CT ABDOMEN PELVIS WO CONTRAST  Result Date: 08/01/2019 CLINICAL DATA:  Severe pain EXAM: CT ABDOMEN AND PELVIS WITHOUT CONTRAST TECHNIQUE: Multidetector CT imaging of the abdomen and pelvis was performed following the standard protocol without IV contrast. COMPARISON:  September 02, 2013. FINDINGS: Lower chest: The visualized heart size within normal limits. No pericardial fluid/thickening. There is a moderate to large paraesophageal hernia present. A right effusion is present. Hepatobiliary: The liver is normal in density without focal abnormality.The main portal vein is patent. The patient is status post cholecystectomy. No biliary ductal dilation. Pancreas: Unremarkable. No pancreatic ductal dilatation or surrounding inflammatory changes. Spleen: Normal in size without focal abnormality. Adrenals/Urinary Tract: Both adrenal glands appear normal. The kidneys and collecting system appear normal without evidence of urinary tract calculus or hydronephrosis. Bladder is unremarkable. Stomach/Bowel: A moderate to large paraesophageal hernia is noted. There is moderately dilated air loops measuring to 3.3 cm to the distal ileum. The terminal ileum appears to be decompressed there appears to be question thickening with surrounding fat stranding changes seen around the cecum. There is air and stool seen throughout the  remainder of the colon to the rectum. No pericolonic free free air is. Vascular/Lymphatic: There are no enlarged mesenteric, retroperitoneal, or pelvic lymph nodes. Scattered aortic atherosclerotic calcifications are seen without aneurysmal dilatation. Reproductive: The patient is status post hysterectomy. No adnexal masses or collections seen. Other: No evidence of abdominal wall mass or hernia. Musculoskeletal: No acute or significant osseous findings. There is a levoconvex scoliotic curvature of the lumbar. Chronic superior compression deformity of the L1 vertebral bodies seen than 5% height. IMPRESSION: 1. Findings consistent with a partial small obstruction to the level of the terminal ileum which appears to be decompressed. There is wall thickening with stranding changes the cecum. Non-specific could be due to colitis however cannot underlying obstructing lesion. Upon resolution of  symptoms would recommend direct visualization. 2. Moderate to large paraesophageal hernia 3.  Aortic Atherosclerosis (ICD10-I70.0). 4. Small right effusion Electronically Signed   By: Prudencio Pair M.D.   On: 08/01/2019 16:50   DG Abd 1 View  Result Date: 08/02/2019 CLINICAL DATA:  Small-bowel obstruction follow-up. EXAM: ABDOMEN - 1 VIEW COMPARISON:  CT scan 08/01/2019 FINDINGS: Persistent dilated air-filled loops of small bowel consistent with small-bowel obstruction. Some residual stool in the rectosigmoid area. No free air is identified. IMPRESSION: Persistent small-bowel obstruction bowel gas pattern. Electronically Signed   By: Marijo Sanes M.D.   On: 08/02/2019 07:03      Assessment/Plan CKD, stage III H/O breast cancer HTN RLS Hiatal hernia  SBO The patient has a SBO noted on her CT scan.  An attempt was made for NGT placement at [facility] and was unsuccessful.  We will reattempt placement here and try to initiate the SBO protocol in order to avoid surgical intervention on this elderly patient.  However, we did  discuss that if she were to fail conservative management, then she may require surgical exploration.  We will remove her purewick so she can mobilize in the interim to prevent worsening deconditioning while here.  The patient seems to understand this plan and is in agreement.  No family is present in the room.   FEN - NPO/NGT/IVFs VTE - heparin ID - none   Henreitta Cea, [facility] Surgery 08/02/2019, 10:29 AM Please see Amion for pager number during day hours 7:00am-4:30pm or 7:00am -11:30am on weekends

## 2019-08-03 ENCOUNTER — Inpatient Hospital Stay (HOSPITAL_COMMUNITY): Payer: Medicare Other

## 2019-08-03 LAB — GLUCOSE, CAPILLARY
Glucose-Capillary: 104 mg/dL — ABNORMAL HIGH (ref 70–99)
Glucose-Capillary: 101 mg/dL — ABNORMAL HIGH (ref 70–99)

## 2019-08-03 LAB — CBC
HCT: 27.8 % — ABNORMAL LOW (ref 36.0–46.0)
Hemoglobin: 8.4 g/dL — ABNORMAL LOW (ref 12.0–15.0)
MCH: 25.4 pg — ABNORMAL LOW (ref 26.0–34.0)
MCHC: 30.2 g/dL (ref 30.0–36.0)
MCV: 84 fL (ref 80.0–100.0)
Platelets: 393 10*3/uL (ref 150–400)
RBC: 3.31 MIL/uL — ABNORMAL LOW (ref 3.87–5.11)
RDW: 14.3 % (ref 11.5–15.5)
WBC: 8 10*3/uL (ref 4.0–10.5)
nRBC: 0 % (ref 0.0–0.2)

## 2019-08-03 LAB — BASIC METABOLIC PANEL
Anion gap: 12 (ref 5–15)
BUN: 45 mg/dL — ABNORMAL HIGH (ref 8–23)
CO2: 27 mmol/L (ref 22–32)
Calcium: 8.7 mg/dL — ABNORMAL LOW (ref 8.9–10.3)
Chloride: 104 mmol/L (ref 98–111)
Creatinine, Ser: 0.97 mg/dL (ref 0.44–1.00)
GFR calc Af Amer: >60 mL/min (ref >60)
GFR calc non Af Amer: 52 mL/min — ABNORMAL LOW (ref >60)
Glucose, Bld: 120 mg/dL — ABNORMAL HIGH (ref 70–99)
Potassium: 3.2 mmol/L — ABNORMAL LOW (ref 3.5–5.1)
Sodium: 143 mmol/L (ref 135–145)

## 2019-08-03 MED ORDER — POTASSIUM CHLORIDE 10 MEQ/100ML IV SOLN
10.0000 meq | INTRAVENOUS | Status: AC
  Administered 2019-08-03 (×6): 10 meq via INTRAVENOUS
  Filled 2019-08-03 (×5): qty 100

## 2019-08-03 NOTE — Progress Notes (Signed)
PROGRESS NOTE    Emmalena Canny  CBS:[id number] DOB: 17-Feb-1928 DOA: 08/01/2019 PCP: Delrae Alfred, DO   Chef Complaints: Nausea vomiting abdominal pain x3 days  Brief Narrative: 86 y.o. female with history of hypertension, chronic kidney disease stage III, breast cancer in remission, restless leg syndrome, chronic pain on tramadol presents to the ER at [facility] with complaints of nausea vomiting abdominal pain for last 3 days.  Has not moved her bowels for the same time.  Pain is generalized and constant.  Denies fever chills chest pain or shortness of breath.  Denies any blood in the vomitus.  ED Course: In the ER CT abdomen pelvis shows features concerning for partial small bowel obstruction.  On-call general surgeon was consulted and patient admitted for further management of partial small bowel obstruction.  Labs are significant for elevated lactic acid which improved with fluids.  Also seen is hyperglycemia elevated creatinine hemoglobin of 10.4 mild leukocytosis Covid test was negative.  EKG was showing sinus tachycardia with LBBB   Subjective:  Complains of upper abdominal pain, has passed flatus, NG tube remains in place.  No bowel movement yet. No other acute events overnight.  Came her because " I got sick" Lives with son uses cane to walk [place] and [place]wick with ivf running  Assessment & Plan:  Small bowel obstruction likely from adhesive disease due to past abdominal surgeries hysterectomy/appendicectomy.  Appreciate surgery input continue as per protocol, KUB this morning with a dilated small bowel and possible transit of contrast to right colon.  I further surgical plan for NG tube clamping today.  Keep on IV fluids, pain control.   Essential hypertension lisinopril HCTZ at home: BP is controlled Home meds remains on hold.  Continue IV medication as needed   AKI on CKD IIIa : Last BUN/creatinine in system was 11/1.0 in August/17/2015 no other labs.  Creatinine  has improved to 1.6-0.9.  Continue gentle IV fluid hydration.  Recent Labs  Lab 08/01/19 1431 08/02/19 0027 08/03/19 0439  BUN 53* 52* 45*  CREATININE 1.60* 1.48* 0.97   Hypokalemia repeating potassium IV, keep potassium at least 4 due to bowel obstruction.  Lactic acidosis up to 5.0 likely from volume depletion and bowel obstruction, lactic acidosis has resolved.   Hyperglycemia. likely from stress. Prediabetes on hba1c.  Monitor intermittently  Normocytic anemia: HBGlobin downtrending, monitor closely . Recent Labs  Lab 08/01/19 1431 08/02/19 0027 08/03/19 0439  HGB 10.4* 9.5* 8.4*  HCT 34.3* 31.4* 27.8*   Hx of breast ca in remission  RLS hx p.o. meds on hold  DVT prophylaxis: heparin injection 5,000 Units Start: 08/02/19 1100 SCDs Start: 08/01/19 2349.  We will need to hold DVT prophylaxis if surgery is planned Code Status: Full code Family Communication: plan of care discussed with patient at bedside.  Called patient's mobile number unable to reach.  Will reattempt  Status is: Inpatient  Remains inpatient appropriate because:IV treatments appropriate due to intensity of illness or inability to take PO and Inpatient level of care appropriate due to severity of illness Remains hospitalized due to ongoing small bowel obstruction  Dispo: The patient is from: Home              Anticipated d/c is to: Home.  PT evaluation after NG tube removed/clapmed/bowel obstruction resolves              Anticipated d/c date is: 1-2 day  Patient currently is not medically stable to d/c.  Nutrition: Diet Order            Diet NPO time specified  Diet effective now                       Body mass index is 25.15 kg/m. Pressure Ulcer:    Consultants:see note  Procedures:see note Microbiology:see note  Medications: Scheduled Meds: . heparin injection (subcutaneous)  5,000 Units Subcutaneous Q8H  . sodium chloride flush  3 mL Intravenous Once   Continuous  Infusions: . sodium chloride 75 mL/hr at 08/03/19 0955  . potassium chloride 10 mEq (08/03/19 1052)    Antimicrobials: Anti-infectives (From admission, onward)   None       Objective: Vitals: Today's Vitals   08/02/19 2054 08/02/19 2346 08/03/19 0015 08/03/19 0548  BP: (!) 155/92   (!) 148/88  Pulse: (!) 106   97  Resp: 18   18  Temp: 98.3 F (36.8 C)   97.8 F (36.6 C)  TempSrc: Oral   Oral  SpO2: 99%   90%  Weight:      Height:      PainSc:  7  2      Intake/Output Summary (Last 24 hours) at 08/03/2019 1129 Last data filed at 08/03/2019 0910 Gross per 24 hour  Intake 579.57 ml  Output 1500 ml  Net -920.43 ml   Filed Weights   08/01/19 1405  Weight: 64.4 kg   Weight change:    Intake/Output from previous day: 07/15 0701 - 07/16 0700 In: 1035.1 [P.O.:1; I.V.:1034.1] Out: 1600 [Urine:100; Emesis/NG output:1500] Intake/Output this shift: No intake/output data recorded.  Examination:  General exam: AAOx3 , NAD, weak appearing. HEENT:Oral mucosa moist, Ear/Nose WNL grossly, dentition normal. Respiratory system: bilaterally clear,no wheezing or crackles,no use of accessory muscle Cardiovascular system: S1 & S2 +, No JVD,. Gastrointestinal system: Abdomen soft, mildly tender upper abdomen bowel sounds present . NGT+ Nervous System:Alert, awake, moving extremities and grossly nonfocal Extremities: No edema, distal peripheral pulses palpable.  Skin: No rashes,no icterus. MSK: Normal muscle bulk,tone, power   Data Reviewed: I have personally reviewed following labs and imaging studies CBC: Recent Labs  Lab 08/01/19 1431 08/02/19 0027 08/03/19 0439  WBC 11.4* 9.2 8.0  HGB 10.4* 9.5* 8.4*  HCT 34.3* 31.4* 27.8*  MCV 83.7 84.0 84.0  PLT 510* 423* 326   Basic Metabolic Panel: Recent Labs  Lab 08/01/19 1431 08/02/19 0027 08/03/19 0439  NA 136 140 143  K 3.7 4.0 3.2*  CL 95* 98 104  CO2 23 30 27   GLUCOSE 214* 141* 120*  BUN 53* 52* 45*  CREATININE  1.60* 1.48* 0.97  CALCIUM 9.2 8.9 8.7*   GFR: Estimated Creatinine Clearance: 35.5 mL/min (by C-G formula based on SCr of 0.97 mg/dL). Liver Function Tests: Recent Labs  Lab 08/01/19 1431 08/02/19 0027  AST 20 12*  ALT 10 10  ALKPHOS 69 63  BILITOT 0.5 0.7  PROT 7.7 7.2  ALBUMIN 3.6 3.4*   Recent Labs  Lab 08/01/19 1431  LIPASE 17   No results for input(s): AMMONIA in the last 168 hours. Coagulation Profile: No results for input(s): INR, PROTIME in the last 168 hours. Cardiac Enzymes: No results for input(s): CKTOTAL, CKMB, CKMBINDEX, TROPONINI in the last 168 hours. BNP (last 3 results) No results for input(s): PROBNP in the last 8760 hours. HbA1C: Recent Labs    08/02/19 0027  HGBA1C 5.8*   CBG: Recent  Labs  Lab 08/02/19 0130 08/02/19 0743 08/02/19 1547 08/03/19 1044  GLUCAP 129* 121* 128* 104*   Lipid Profile: No results for input(s): CHOL, HDL, LDLCALC, TRIG, CHOLHDL, LDLDIRECT in the last 72 hours. Thyroid Function Tests: No results for input(s): TSH, T4TOTAL, FREET4, T3FREE, THYROIDAB in the last 72 hours. Anemia Panel: No results for input(s): VITAMINB12, FOLATE, FERRITIN, TIBC, IRON, RETICCTPCT in the last 72 hours. Sepsis Labs: Recent Labs  Lab 08/01/19 1431 08/01/19 1649 08/02/19 0027 08/02/19 0424  LATICACIDVEN 5.0* 2.4* 1.1 0.9    Recent Results (from the past 240 hour(s))  SARS Coronavirus 2 by RT PCR (hospital order, performed in [facility] hospital lab) Nasopharyngeal Nasopharyngeal Swab     Status: None   Collection Time: 08/01/19  2:31 PM   Specimen: Nasopharyngeal Swab  Result Value Ref Range Status   SARS Coronavirus 2 NEGATIVE NEGATIVE Final    Comment: (NOTE) SARS-CoV-2 target nucleic acids are NOT DETECTED.  The SARS-CoV-2 RNA is generally detectable in upper and lower respiratory specimens during the acute phase of infection. The lowest concentration of SARS-CoV-2 viral copies this assay can detect is 250 copies / mL. A  negative result does not preclude SARS-CoV-2 infection and should not be used as the sole basis for treatment or other patient management decisions.  A negative result may occur with improper specimen collection / handling, submission of specimen other than nasopharyngeal swab, presence of viral mutation(s) within the areas targeted by this assay, and inadequate number of viral copies (<250 copies / mL). A negative result must be combined with clinical observations, patient history, and epidemiological information.  Fact Sheet for Patients:   StrictlyIdeas.no  Fact Sheet for Healthcare Providers: BankingDealers.co.za  This test is not yet approved or  cleared by the [place] FDA and has been authorized for detection and/or diagnosis of SARS-CoV-2 by FDA under an Emergency Use Authorization (EUA).  This EUA will remain in effect (meaning this test can be used) for the duration of the COVID-19 declaration under Section 564(b)(1) of the Act, 21 U.S.C. section 360bbb-3(b)(1), unless the authorization is terminated or revoked sooner.  Performed at [facility], [address] [place], [place] [address]       Radiology Studies: CT ABDOMEN PELVIS WO CONTRAST  Result Date: 08/01/2019 CLINICAL DATA:  Severe pain EXAM: CT ABDOMEN AND PELVIS WITHOUT CONTRAST TECHNIQUE: Multidetector CT imaging of the abdomen and pelvis was performed following the standard protocol without IV contrast. COMPARISON:  September 02, 2013. FINDINGS: Lower chest: The visualized heart size within normal limits. No pericardial fluid/thickening. There is a moderate to large paraesophageal hernia present. A right effusion is present. Hepatobiliary: The liver is normal in density without focal abnormality.The main portal vein is patent. The patient is status post cholecystectomy. No biliary ductal dilation. Pancreas: Unremarkable. No pancreatic ductal dilatation or  surrounding inflammatory changes. Spleen: Normal in size without focal abnormality. Adrenals/Urinary Tract: Both adrenal glands appear normal. The kidneys and collecting system appear normal without evidence of urinary tract calculus or hydronephrosis. Bladder is unremarkable. Stomach/Bowel: A moderate to large paraesophageal hernia is noted. There is moderately dilated air loops measuring to 3.3 cm to the distal ileum. The terminal ileum appears to be decompressed there appears to be question thickening with surrounding fat stranding changes seen around the cecum. There is air and stool seen throughout the remainder of the colon to the rectum. No pericolonic free free air is. Vascular/Lymphatic: There are no enlarged mesenteric, retroperitoneal, or pelvic  lymph nodes. Scattered aortic atherosclerotic calcifications are seen without aneurysmal dilatation. Reproductive: The patient is status post hysterectomy. No adnexal masses or collections seen. Other: No evidence of abdominal wall mass or hernia. Musculoskeletal: No acute or significant osseous findings. There is a levoconvex scoliotic curvature of the lumbar. Chronic superior compression deformity of the L1 vertebral bodies seen than 5% height. IMPRESSION: 1. Findings consistent with a partial small obstruction to the level of the terminal ileum which appears to be decompressed. There is wall thickening with stranding changes the cecum. Non-specific could be due to colitis however cannot underlying obstructing lesion. Upon resolution of symptoms would recommend direct visualization. 2. Moderate to large paraesophageal hernia 3.  Aortic Atherosclerosis (ICD10-I70.0). 4. Small right effusion Electronically Signed   By: Prudencio Pair M.D.   On: 08/01/2019 16:50   DG Abd 1 View  Result Date: 08/02/2019 CLINICAL DATA:  Small-bowel obstruction follow-up. EXAM: ABDOMEN - 1 VIEW COMPARISON:  CT scan 08/01/2019 FINDINGS: Persistent dilated air-filled loops of small  bowel consistent with small-bowel obstruction. Some residual stool in the rectosigmoid area. No free air is identified. IMPRESSION: Persistent small-bowel obstruction bowel gas pattern. Electronically Signed   By: Marijo Sanes M.D.   On: 08/02/2019 07:03   DG Abd Portable 1V  Result Date: 08/03/2019 CLINICAL DATA:  Follow-up bowel obstruction. EXAM: PORTABLE ABDOMEN - 1 VIEW COMPARISON:  08/02/2019 FINDINGS: There is a nasogastric tube within the upper abdomen in the expected location of the distal stomach. Contrast opacification of the dilated small bowel loops again noted. Compared with the previous exam there is been mild interval improvement in the degree of small-bowel dilatation. Enteric contrast material is now noted within the colon at the level of the hepatic flexure. IMPRESSION: Mild interval improvement in small bowel obstruction pattern. Enteric contrast material is now noted within the colon at the level of the hepatic flexure. Electronically Signed   By: Kerby Moors M.D.   On: 08/03/2019 10:27   DG Abd Portable 1V-Small Bowel Obstruction Protocol-initial, 8 hr delay  Result Date: 08/02/2019 CLINICAL DATA:  Small-bowel obstruction EXAM: PORTABLE ABDOMEN - 1 VIEW COMPARISON:  11:30 a.m. FINDINGS: Nasogastric tube overlies the mid body of the stomach. Numerous dilated gas and contrast filled loops of small bowel are seen throughout the abdomen in keeping with a distal small-bowel obstruction. The degree of small-bowel dilation appears unchanged from prior examination. The large bowel appears decompressed. No gross free intraperitoneal gas. No other changes identified. IMPRESSION: Stable findings of distal small bowel obstruction. Contrast from prior CT examination has now progressed into the distal small bowel, but has not yet passed into the colon. Electronically Signed   By: Fidela Salisbury MD   On: 08/02/2019 22:49   DG Abd Portable 1V-Small Bowel Protocol-Position Verification  Result  Date: 08/02/2019 CLINICAL DATA:  Nasogastric placement EXAM: PORTABLE ABDOMEN - 1 VIEW COMPARISON:  Earlier same day FINDINGS: Nasogastric 2 passes through the hiatal hernia, under the diaphragm into the expected location of the gastric antrum or pylorus. Small-bowel obstruction pattern as seen previously. IMPRESSION: Nasogastric tube enters the abdomen, with the tip in the expected location of the antrum or pylorus. Electronically Signed   By: Nelson Chimes M.D.   On: 08/02/2019 13:33     LOS: 2 days   Antonieta Pert, MD [facility]  08/03/2019, 11:29 AM

## 2019-08-03 NOTE — Progress Notes (Signed)
[facility] Progress Note     Subjective: CC:  Reports upper abdominal soreness and a tiny amt flatus. Denies BM.   Objective: Vital signs in last 24 hours: Temp:  [97.8 F (36.6 C)-98.5 F (36.9 C)] 97.8 F (36.6 C) (07/16 0548) Pulse Rate:  [97-108] 97 (07/16 0548) Resp:  [18] 18 (07/16 0548) BP: (141-155)/(74-92) 148/88 (07/16 0548) SpO2:  [90 %-99 %] 90 % (07/16 0548) Last BM Date: 07/30/19  Intake/Output from previous day: 07/15 0701 - 07/16 0700 In: 1035.1 [P.O.:1; I.V.:1034.1] Out: 1600 [Urine:100; Emesis/NG output:1500] Intake/Output this shift: No intake/output data recorded.  PE: Gen:  Alert, NAD, pleasant Card:  Regular rate and rhythm, pedal pulses 2+ BL Pulm:  Normal effort, clear to auscultation bilaterally Abd: Soft, mild epigastric tenderness without guarding, mild distention, +BS,   NG 1500cc/24h, light brown, clogged during my exam, I flushed with air and 30 cc water and it started working again. Skin: warm and dry, no rashes  Psych: A&Ox3   Lab Results:  Recent Labs    08/02/19 0027 08/03/19 0439  WBC 9.2 8.0  HGB 9.5* 8.4*  HCT 31.4* 27.8*  PLT 423* 393   BMET Recent Labs    08/02/19 0027 08/03/19 0439  NA 140 143  K 4.0 3.2*  CL 98 104  CO2 30 27  GLUCOSE 141* 120*  BUN 52* 45*  CREATININE 1.48* 0.97  CALCIUM 8.9 8.7*   PT/INR No results for input(s): LABPROT, INR in the last 72 hours. CMP     Component Value Date/Time   NA 143 08/03/2019 0439   NA 143 09/03/2013 0433   K 3.2 (L) 08/03/2019 0439   K 3.9 09/03/2013 0433   CL 104 08/03/2019 0439   CL 112 (H) 09/03/2013 0433   CO2 27 08/03/2019 0439   CO2 22 09/03/2013 0433   GLUCOSE 120 (H) 08/03/2019 0439   GLUCOSE 124 (H) 09/03/2013 0433   BUN 45 (H) 08/03/2019 0439   BUN 11 09/03/2013 0433   CREATININE 0.97 08/03/2019 0439   CREATININE 1.04 09/03/2013 0433   CALCIUM 8.7 (L) 08/03/2019 0439   CALCIUM 8.1 (L) 09/03/2013 0433   PROT 7.2 08/02/2019 0027    PROT 8.1 09/01/2013 2320   ALBUMIN 3.4 (L) 08/02/2019 0027   ALBUMIN 3.9 09/01/2013 2320   AST 12 (L) 08/02/2019 0027   AST 39 (H) 09/01/2013 2320   ALT 10 08/02/2019 0027   ALT 28 09/01/2013 2320   ALKPHOS 63 08/02/2019 0027   ALKPHOS 72 09/01/2013 2320   BILITOT 0.7 08/02/2019 0027   BILITOT 0.4 09/01/2013 2320   GFRNONAA 52 (L) 08/03/2019 0439   GFRNONAA 50 (L) 09/03/2013 0433   GFRAA >60 08/03/2019 0439   GFRAA 58 (L) 09/03/2013 0433   Lipase     Component Value Date/Time   LIPASE 17 08/01/2019 1431   LIPASE 146 09/01/2013 2320       Studies/Results: CT ABDOMEN PELVIS WO CONTRAST  Result Date: 08/01/2019 CLINICAL DATA:  Severe pain EXAM: CT ABDOMEN AND PELVIS WITHOUT CONTRAST TECHNIQUE: Multidetector CT imaging of the abdomen and pelvis was performed following the standard protocol without IV contrast. COMPARISON:  September 02, 2013. FINDINGS: Lower chest: The visualized heart size within normal limits. No pericardial fluid/thickening. There is a moderate to large paraesophageal hernia present. A right effusion is present. Hepatobiliary: The liver is normal in density without focal abnormality.The main portal vein is patent. The patient is status post cholecystectomy. No biliary ductal dilation. Pancreas: Unremarkable.  No pancreatic ductal dilatation or surrounding inflammatory changes. Spleen: Normal in size without focal abnormality. Adrenals/Urinary Tract: Both adrenal glands appear normal. The kidneys and collecting system appear normal without evidence of urinary tract calculus or hydronephrosis. Bladder is unremarkable. Stomach/Bowel: A moderate to large paraesophageal hernia is noted. There is moderately dilated air loops measuring to 3.3 cm to the distal ileum. The terminal ileum appears to be decompressed there appears to be question thickening with surrounding fat stranding changes seen around the cecum. There is air and stool seen throughout the remainder of the colon to the  rectum. No pericolonic free free air is. Vascular/Lymphatic: There are no enlarged mesenteric, retroperitoneal, or pelvic lymph nodes. Scattered aortic atherosclerotic calcifications are seen without aneurysmal dilatation. Reproductive: The patient is status post hysterectomy. No adnexal masses or collections seen. Other: No evidence of abdominal wall mass or hernia. Musculoskeletal: No acute or significant osseous findings. There is a levoconvex scoliotic curvature of the lumbar. Chronic superior compression deformity of the L1 vertebral bodies seen than 5% height. IMPRESSION: 1. Findings consistent with a partial small obstruction to the level of the terminal ileum which appears to be decompressed. There is wall thickening with stranding changes the cecum. Non-specific could be due to colitis however cannot underlying obstructing lesion. Upon resolution of symptoms would recommend direct visualization. 2. Moderate to large paraesophageal hernia 3.  Aortic Atherosclerosis (ICD10-I70.0). 4. Small right effusion Electronically Signed   By: Prudencio Pair M.D.   On: 08/01/2019 16:50   DG Abd 1 View  Result Date: 08/02/2019 CLINICAL DATA:  Small-bowel obstruction follow-up. EXAM: ABDOMEN - 1 VIEW COMPARISON:  CT scan 08/01/2019 FINDINGS: Persistent dilated air-filled loops of small bowel consistent with small-bowel obstruction. Some residual stool in the rectosigmoid area. No free air is identified. IMPRESSION: Persistent small-bowel obstruction bowel gas pattern. Electronically Signed   By: Marijo Sanes M.D.   On: 08/02/2019 07:03   DG Abd Portable 1V-Small Bowel Obstruction Protocol-initial, 8 hr delay  Result Date: 08/02/2019 CLINICAL DATA:  Small-bowel obstruction EXAM: PORTABLE ABDOMEN - 1 VIEW COMPARISON:  11:30 a.m. FINDINGS: Nasogastric tube overlies the mid body of the stomach. Numerous dilated gas and contrast filled loops of small bowel are seen throughout the abdomen in keeping with a distal  small-bowel obstruction. The degree of small-bowel dilation appears unchanged from prior examination. The large bowel appears decompressed. No gross free intraperitoneal gas. No other changes identified. IMPRESSION: Stable findings of distal small bowel obstruction. Contrast from prior CT examination has now progressed into the distal small bowel, but has not yet passed into the colon. Electronically Signed   By: Fidela Salisbury MD   On: 08/02/2019 22:49   DG Abd Portable 1V-Small Bowel Protocol-Position Verification  Result Date: 08/02/2019 CLINICAL DATA:  Nasogastric placement EXAM: PORTABLE ABDOMEN - 1 VIEW COMPARISON:  Earlier same day FINDINGS: Nasogastric 2 passes through the hiatal hernia, under the diaphragm into the expected location of the gastric antrum or pylorus. Small-bowel obstruction pattern as seen previously. IMPRESSION: Nasogastric tube enters the abdomen, with the tip in the expected location of the antrum or pylorus. Electronically Signed   By: Nelson Chimes M.D.   On: 08/02/2019 13:33    Anti-infectives: Anti-infectives (From admission, onward)   None     Assessment/Plan CKD, stage III H/O breast cancer HTN RLS Hiatal hernia  SBO - likely related to adhesive disease from past abdominal surgeries (hysterectomy, open appy) - SB protocol >> KUB this AM with dilated small bowel and possible  transit of contrast to R colon, will await final read from radiologist. - clinically remains partially obstructed with high NG output and minimal flatus, no BMs - continue NG to LIWS and monitor, may be able to clamp NG tube later today/tomorrow if clinically progresses   LOS: 2 days    Obie Dredge, [facility] Please see Amion for pager number during day hours 7:00am-4:30pm

## 2019-08-03 NOTE — Care Management Important Message (Signed)
Important Message  Patient Details IM Letter presented to the Patient Name: Arpi Diebold MRN: [id number] Date of Birth: [birth year]   Medicare Important Message Given:  Yes     Kerin Salen 08/03/2019, 2:54 PM

## 2019-08-04 LAB — BASIC METABOLIC PANEL
Anion gap: 10 (ref 5–15)
BUN: 31 mg/dL — ABNORMAL HIGH (ref 8–23)
CO2: 24 mmol/L (ref 22–32)
Calcium: 8.3 mg/dL — ABNORMAL LOW (ref 8.9–10.3)
Chloride: 107 mmol/L (ref 98–111)
Creatinine, Ser: 0.69 mg/dL (ref 0.44–1.00)
GFR calc Af Amer: >60 mL/min (ref >60)
GFR calc non Af Amer: >60 mL/min (ref >60)
Glucose, Bld: 92 mg/dL (ref 70–99)
Potassium: 4.1 mmol/L (ref 3.5–5.1)
Sodium: 141 mmol/L (ref 135–145)

## 2019-08-04 LAB — CBC
HCT: 28.3 % — ABNORMAL LOW (ref 36.0–46.0)
Hemoglobin: 8.2 g/dL — ABNORMAL LOW (ref 12.0–15.0)
MCH: 25.2 pg — ABNORMAL LOW (ref 26.0–34.0)
MCHC: 29 g/dL — ABNORMAL LOW (ref 30.0–36.0)
MCV: 87.1 fL (ref 80.0–100.0)
Platelets: 354 10*3/uL (ref 150–400)
RBC: 3.25 MIL/uL — ABNORMAL LOW (ref 3.87–5.11)
RDW: 14.3 % (ref 11.5–15.5)
WBC: 10.9 10*3/uL — ABNORMAL HIGH (ref 4.0–10.5)
nRBC: 0 % (ref 0.0–0.2)

## 2019-08-04 LAB — GLUCOSE, CAPILLARY
Glucose-Capillary: 84 mg/dL (ref 70–99)
Glucose-Capillary: 86 mg/dL (ref 70–99)

## 2019-08-04 MED ORDER — LACTATED RINGERS IV SOLN: INTRAVENOUS | Status: DC

## 2019-08-04 MED ORDER — PNEUMOCOCCAL VAC POLYVALENT 25 MCG/0.5ML IJ INJ
0.5000 mL | INJECTION | INTRAMUSCULAR | Status: DC
  Filled 2019-08-04: qty 0.5

## 2019-08-04 NOTE — Progress Notes (Signed)
NG clamped at 1100.

## 2019-08-04 NOTE — Evaluation (Signed)
Physical Therapy Evaluation Patient Details Name: Amber Hunter MRN: [id number] DOB: Jun 18, 1928 Today's Date: 08/04/2019   History of Present Illness  Pt is 86 y.o. female with history of hypertension, chronic kidney disease stage III, breast cancer in remission, restless leg syndrome, chronic pain on tramadol presents to the ER with complaints of nausea vomiting abdominal pain for last 3 days.  Pt admitted with SBO likely from adhesive disease.  Pt is currently with NG tube, no surgical intervention at this time.  Clinical Impression  Pt admitted with above diagnosis. Pt requiring min guard to min A with transfers and gait.  She was able to ambulate in room but fatigued easily.  Pt with mild unsteadiness.  Pt ambulated with cane at baseline and has 24 hr support at home.  She is expected to progress well with therapy.  Pt currently with functional limitations due to the deficits listed below (see PT Problem List). Pt will benefit from skilled PT to increase their independence and safety with mobility to allow discharge to the venue listed below.       Follow Up Recommendations Home health PT    Equipment Recommendations  Rolling walker with 5" wheels    Recommendations for Other Services       Precautions / Restrictions Precautions Precautions: Fall Precaution Comments: NG tube      Mobility  Bed Mobility Overal bed mobility: Needs Assistance Bed Mobility: Supine to Sit;Rolling Rolling: Modified independent (Device/Increase time)   Supine to sit: Min assist        Transfers Overall transfer level: Needs assistance Equipment used: 1 person hand held assist Transfers: Sit to/from Stand Sit to Stand: Min guard         General transfer comment: performed x 2 with min guard for safety  Ambulation/Gait Ambulation/Gait assistance: Min guard Gait Distance (Feet): 30 Feet Assistive device: 1 person hand held assist Gait Pattern/deviations: Decreased stride length;Trunk  flexed Gait velocity: decreased   General Gait Details: mild unsteadiness; fatigued easily; declined ambulation in hall  Stairs            Wheelchair Mobility    Modified Rankin (Stroke Patients Only)       Balance Overall balance assessment: Needs assistance Sitting-balance support: No upper extremity supported Sitting balance-Leahy Scale: Good     Standing balance support: No upper extremity supported;During functional activity;Single extremity supported Standing balance-Leahy Scale: Fair Standing balance comment: able to pull up brief in standing without support but preferred at least 1 UE supported with gait                             Pertinent Vitals/Pain Pain Assessment: 0-10 Pain Score: 4  Pain Location: stomach Pain Descriptors / Indicators: Discomfort;Sore Pain Intervention(s): Limited activity within patient's tolerance;Monitored during session;Repositioned    Home Living Family/patient expects to be discharged to:: Private residence Living Arrangements: Children (son and daughter in law) Available Help at Discharge: Family;Available 24 hours/day Type of Home: House Home Access: Stairs to enter Entrance Stairs-Rails: Psychiatric nurse of Steps: 6 Home Layout: One level Home Equipment: Cane - single point;Bedside commode;Shower seat;Grab bars - tub/shower      Prior Function Level of Independence: Needs assistance   Gait / Transfers Assistance Needed: Pt uses a cane; could ambulate short community distances  ADL's / Homemaking Assistance Needed: Could dress and toilet independently but had assist with bathing (either sponge bath or shower)).  Had assist with IADLs.  Comments: denies falls     Hand Dominance        Extremity/Trunk Assessment   Upper Extremity Assessment Upper Extremity Assessment: RUE deficits/detail;LUE deficits/detail RUE Deficits / Details: ROM WFL except shoulder elevation only ~30 degrees  (reports prior sx); MMT 4/5 LUE Deficits / Details: ROM WFL ; MMT 4/5    Lower Extremity Assessment Lower Extremity Assessment: Overall WFL for tasks assessed    Cervical / Trunk Assessment Cervical / Trunk Assessment: Kyphotic  Communication      Cognition Arousal/Alertness: Awake/alert Behavior During Therapy: WFL for tasks assessed/performed Overall Cognitive Status: Within Functional Limits for tasks assessed                                        General Comments General comments (skin integrity, edema, etc.): VSS (pt was on 2 LPM O2 with sats 98%)  . REmoved O2 and sats remained 96%. Left on RA.    Exercises     Assessment/Plan    PT Assessment Patient needs continued PT services  PT Problem List Decreased strength;Decreased mobility;Decreased coordination;Decreased knowledge of precautions;Decreased activity tolerance;Decreased balance;Decreased knowledge of use of DME;Decreased range of motion;Decreased safety awareness       PT Treatment Interventions DME instruction;Therapeutic activities;Gait training;Therapeutic exercise;Patient/family education;Stair training;Balance training;Functional mobility training    PT Goals (Current goals can be found in the Care Plan section)  Acute Rehab PT Goals Patient Stated Goal: return home PT Goal Formulation: With patient Time For Goal Achievement: 08/18/19 Potential to Achieve Goals: Good    Frequency Min 3X/week   Barriers to discharge        Co-evaluation               AM-PAC PT "6 Clicks" Mobility  Outcome Measure Help needed turning from your back to your side while in a flat bed without using bedrails?: None Help needed moving from lying on your back to sitting on the side of a flat bed without using bedrails?: A Little Help needed moving to and from a bed to a chair (including a wheelchair)?: A Little Help needed standing up from a chair using your arms (e.g., wheelchair or bedside  chair)?: None Help needed to walk in hospital room?: A Little Help needed climbing 3-5 steps with a railing? : A Little 6 Click Score: 20    End of Session Equipment Utilized During Treatment: Gait belt Activity Tolerance: Patient tolerated treatment well Patient left: with chair alarm set;in chair;with call bell/phone within reach Nurse Communication: Mobility status PT Visit Diagnosis: Unsteadiness on feet (R26.81);Muscle weakness (generalized) (M62.81)    Time: 6759-1638 PT Time Calculation (min) (ACUTE ONLY): 28 min   Charges:   PT Evaluation $PT Eval Low Complexity: 1 Low PT Treatments $Gait Training: 8-22 mins        Abran Richard, PT Acute Rehab Services Pager [phone] [facility] [place] 08/04/2019, 1:35 PM

## 2019-08-04 NOTE — Progress Notes (Signed)
Subjective/Chief Complaint: NG tube Patient has flatus.  No bowel movement.  Her nausea and vomiting is better and she has no abdominal pain currently.  She is complaining about NG tube   Objective: Vital signs in last 24 hours: Temp:  [97.4 F (36.3 C)-98.5 F (36.9 C)] 98.1 F (36.7 C) (07/17 0629) Pulse Rate:  [93-107] 94 (07/17 0629) Resp:  [18] 18 (07/17 0629) BP: (149-175)/(75-89) 156/83 (07/17 0643) SpO2:  [96 %-100 %] 100 % (07/17 0629) Last BM Date: 08/04/19  Intake/Output from previous day: 07/16 0701 - 07/17 0700 In: 2274.7 [P.O.:150; I.V.:1524.7; IV Piggyback:600] Out: 700 [Emesis/NG output:700] Intake/Output this shift: Total I/O In: 227.3 [I.V.:227.3] Out: 150 [Emesis/NG output:150]  General appearance: alert and cooperative Resp: clear to auscultation bilaterally Cardio: regular rate and rhythm, S1, S2 normal, no murmur, click, rub or gallop GI: Soft nontender without rebound or guarding.  No distention  Lab Results:  Recent Labs    08/03/19 0439 08/04/19 0543  WBC 8.0 10.9*  HGB 8.4* 8.2*  HCT 27.8* 28.3*  PLT 393 354   BMET Recent Labs    08/03/19 0439 08/04/19 0543  NA 143 141  K 3.2* 4.1  CL 104 107  CO2 27 24  GLUCOSE 120* 92  BUN 45* 31*  CREATININE 0.97 0.69  CALCIUM 8.7* 8.3*   PT/INR No results for input(s): LABPROT, INR in the last 72 hours. ABG No results for input(s): PHART, HCO3 in the last 72 hours.  Invalid input(s): PCO2, PO2  Studies/Results: DG Abd Portable 1V  Result Date: 08/03/2019 CLINICAL DATA:  Follow-up bowel obstruction. EXAM: PORTABLE ABDOMEN - 1 VIEW COMPARISON:  08/02/2019 FINDINGS: There is a nasogastric tube within the upper abdomen in the expected location of the distal stomach. Contrast opacification of the dilated small bowel loops again noted. Compared with the previous exam there is been mild interval improvement in the degree of small-bowel dilatation. Enteric contrast material is now noted  within the colon at the level of the hepatic flexure. IMPRESSION: Mild interval improvement in small bowel obstruction pattern. Enteric contrast material is now noted within the colon at the level of the hepatic flexure. Electronically Signed   By: Kerby Moors M.D.   On: 08/03/2019 10:27   DG Abd Portable 1V-Small Bowel Obstruction Protocol-initial, 8 hr delay  Result Date: 08/02/2019 CLINICAL DATA:  Small-bowel obstruction EXAM: PORTABLE ABDOMEN - 1 VIEW COMPARISON:  11:30 a.m. FINDINGS: Nasogastric tube overlies the mid body of the stomach. Numerous dilated gas and contrast filled loops of small bowel are seen throughout the abdomen in keeping with a distal small-bowel obstruction. The degree of small-bowel dilation appears unchanged from prior examination. The large bowel appears decompressed. No gross free intraperitoneal gas. No other changes identified. IMPRESSION: Stable findings of distal small bowel obstruction. Contrast from prior CT examination has now progressed into the distal small bowel, but has not yet passed into the colon. Electronically Signed   By: Fidela Salisbury MD   On: 08/02/2019 22:49   DG Abd Portable 1V-Small Bowel Protocol-Position Verification  Result Date: 08/02/2019 CLINICAL DATA:  Nasogastric placement EXAM: PORTABLE ABDOMEN - 1 VIEW COMPARISON:  Earlier same day FINDINGS: Nasogastric 2 passes through the hiatal hernia, under the diaphragm into the expected location of the gastric antrum or pylorus. Small-bowel obstruction pattern as seen previously. IMPRESSION: Nasogastric tube enters the abdomen, with the tip in the expected location of the antrum or pylorus. Electronically Signed   By: Jan Fireman.D.  On: 08/02/2019 13:33    Anti-infectives: Anti-infectives (From admission, onward)   None      Assessment/Plan:  CKD, stage III H/O breast cancer HTN RLS Hiatal hernia  SBO - likely related to adhesive disease from past abdominal surgeries (hysterectomy,  open appy) - SB protocol >> KUB  with dilated small bowel contrast to R colon - clinically remains partially obstructed with LOWER  NG output and minimal flatus, no BMs - clamp NGT for 6 hours  If no N/V DC Hold diet until tomorrow   LOS: 3 days    Marcello Moores A Landry Lookingbill 08/04/2019

## 2019-08-04 NOTE — Progress Notes (Signed)
PROGRESS NOTE    Amber Hunter  OEU:[id number] DOB: 13-Feb-1928 DOA: 08/01/2019 PCP: Delrae Alfred, DO   Chef Complaints: Nausea vomiting abdominal pain x3 days  Brief Narrative: 86 y.o. female with history of hypertension, chronic kidney disease stage III, breast cancer in remission, restless leg syndrome, chronic pain on tramadol presents to the ER at [facility] with complaints of nausea vomiting abdominal pain for last 3 days.  Has not moved her bowels for the same time.  Pain is generalized and constant.  Denies fever chills chest pain or shortness of breath.  Denies any blood in the vomitus.  ED Course: In the ER CT abdomen pelvis shows features concerning for partial small bowel obstruction.  On-call general surgeon was consulted and patient admitted for further management of partial small bowel obstruction.  Labs are significant for elevated lactic acid which improved with fluids.  Also seen is hyperglycemia elevated creatinine hemoglobin of 10.4 mild leukocytosis Covid test was negative.  EKG was showing sinus tachycardia with LBBB   Subjective:  Reports passing flatus, no abdominal pain.  Overall much better.  Complains of an NG tube  reports she lives with son uses cane to walk   Assessment & Plan:  Small bowel obstruction likely from adhesive disease due to past abdominal surgeries hysterectomy/appendicectomy: On NGT decompression IV fluids.  Appreciate surgery input plan for clamp NG tube for 6 hours if no nausea vomiting DC, hold diet until tomorrow.  Continue supportive measures ivf, pain control.  Essential hypertension lisinopril HCTZ at home: BP borderline controlled Home meds remains on hold.  Continue as needed IV medication.  Probably can resume p.o. soon.  AKI on CKD IIIa : Creatinine has improved to 0.6.  BUN has also down to 31.  Continue IV fluid hydration gently.   Recent Labs  Lab 08/01/19 1431 08/02/19 0027 08/03/19 0439 08/04/19 0543  BUN 53*  52* 45* 31*  CREATININE 1.60* 1.48* 0.97 0.69   Hypokalemia has resolved.   Recent Labs  Lab 08/01/19 1431 08/02/19 0027 08/03/19 0439 08/04/19 0543  K 3.7 4.0 3.2* 4.1   Lactic acidosis up to 5.0 likely from volume depletion and bowel obstruction, lactic acidosis has resolved.   Hyperglycemia. likely from stress. Prediabetes on hba1c.  Monitor intermittently  Normocytic anemia: hb stable.  Did drop to 8.2 gm likely from hemdilution,no obvious bleeding. Recent Labs  Lab 08/01/19 1431 08/02/19 0027 08/03/19 0439 08/04/19 0543  HGB 10.4* 9.5* 8.4* 8.2*  HCT 34.3* 31.4* 27.8* 28.3*   Hx of breast ca in remission  RLS hx p.o. meds on hold  DVT prophylaxis: heparin injection 5,000 Units Start: 08/02/19 1100 SCDs Start: 08/01/19 2349.   Code Status: Full code Family Communication: plan of care discussed with patient at bedside.  Called patient's son mobile number unable to reach 7/16  Status is: Inpatient  Remains inpatient appropriate because:IV treatments appropriate due to intensity of illness or inability to take PO and Inpatient level of care appropriate due to severity of illness Remains hospitalized due to ongoing small bowel obstruction  Dispo: The patient is from: Home              Anticipated d/c is to: Home.  Continue to ambulate PT OT.               Anticipated d/c date is: 1-2 day              Patient currently is not medically stable to d/c.  Nutrition:  Diet Order            Diet NPO time specified  Diet effective now                       Body mass index is 25.15 kg/m.  Consultants:see note  Procedures:see note Microbiology:see note  Medications: Scheduled Meds: . heparin injection (subcutaneous)  5,000 Units Subcutaneous Q8H  . sodium chloride flush  3 mL Intravenous Once   Continuous Infusions:   Antimicrobials: Anti-infectives (From admission, onward)   None       Objective: Vitals: Today's Vitals   08/04/19 0223 08/04/19  0253 08/04/19 0629 08/04/19 0643  BP:   (!) 161/75 (!) 156/83  Pulse:   94   Resp:   18   Temp:   98.1 F (36.7 C)   TempSrc:   Oral   SpO2:   100%   Weight:      Height:      PainSc: 5  Asleep      Intake/Output Summary (Last 24 hours) at 08/04/2019 1425 Last data filed at 08/04/2019 0904 Gross per 24 hour  Intake 1522.5 ml  Output 750 ml  Net 772.5 ml   Filed Weights   08/01/19 1405  Weight: 64.4 kg   Weight change:    Intake/Output from previous day: 07/16 0701 - 07/17 0700 In: 2274.7 [P.O.:150; I.V.:1524.7; IV Piggyback:600] Out: 700 [Emesis/NG output:700] Intake/Output this shift: Total I/O In: 227.3 [I.V.:227.3] Out: 150 [Emesis/NG output:150]  Examination:  General exam: AAOx3 , NAD, weak appearing. HEENT:Oral mucosa moist, Ear/Nose WNL grossly, dentition normal. Respiratory system: bilaterally clear,no wheezing or crackles,no use of accessory muscle Cardiovascular system: S1 & S2 +, No JVD,. Gastrointestinal system: Abdomen soft, NT,ND, BS+ Nervous System:Alert, awake, moving extremities and grossly nonfocal Extremities: No edema, distal peripheral pulses palpable.  Skin: No rashes,no icterus. MSK: Normal muscle bulk,tone, power Data Reviewed: I have personally reviewed following labs and imaging studies CBC: Recent Labs  Lab 08/01/19 1431 08/02/19 0027 08/03/19 0439 08/04/19 0543  WBC 11.4* 9.2 8.0 10.9*  HGB 10.4* 9.5* 8.4* 8.2*  HCT 34.3* 31.4* 27.8* 28.3*  MCV 83.7 84.0 84.0 87.1  PLT 510* 423* 393 607   Basic Metabolic Panel: Recent Labs  Lab 08/01/19 1431 08/02/19 0027 08/03/19 0439 08/04/19 0543  NA 136 140 143 141  K 3.7 4.0 3.2* 4.1  CL 95* 98 104 107  CO2 23 30 27 24   GLUCOSE 214* 141* 120* 92  BUN 53* 52* 45* 31*  CREATININE 1.60* 1.48* 0.97 0.69  CALCIUM 9.2 8.9 8.7* 8.3*   GFR: Estimated Creatinine Clearance: 43 mL/min (by C-G formula based on SCr of 0.69 mg/dL). Liver Function Tests: Recent Labs  Lab 08/01/19 1431  08/02/19 0027  AST 20 12*  ALT 10 10  ALKPHOS 69 63  BILITOT 0.5 0.7  PROT 7.7 7.2  ALBUMIN 3.6 3.4*   Recent Labs  Lab 08/01/19 1431  LIPASE 17   No results for input(s): AMMONIA in the last 168 hours. Coagulation Profile: No results for input(s): INR, PROTIME in the last 168 hours. Cardiac Enzymes: No results for input(s): CKTOTAL, CKMB, CKMBINDEX, TROPONINI in the last 168 hours. BNP (last 3 results) No results for input(s): PROBNP in the last 8760 hours. HbA1C: Recent Labs    08/02/19 0027  HGBA1C 5.8*   CBG: Recent Labs  Lab 08/02/19 1547 08/03/19 1044 08/03/19 1830 08/04/19 0358 08/04/19 0758  GLUCAP 128* 104* 101* 84 86  Lipid Profile: No results for input(s): CHOL, HDL, LDLCALC, TRIG, CHOLHDL, LDLDIRECT in the last 72 hours. Thyroid Function Tests: No results for input(s): TSH, T4TOTAL, FREET4, T3FREE, THYROIDAB in the last 72 hours. Anemia Panel: No results for input(s): VITAMINB12, FOLATE, FERRITIN, TIBC, IRON, RETICCTPCT in the last 72 hours. Sepsis Labs: Recent Labs  Lab 08/01/19 1431 08/01/19 1649 08/02/19 0027 08/02/19 0424  LATICACIDVEN 5.0* 2.4* 1.1 0.9    Recent Results (from the past 240 hour(s))  SARS Coronavirus 2 by RT PCR (hospital order, performed in [facility] hospital lab) Nasopharyngeal Nasopharyngeal Swab     Status: None   Collection Time: 08/01/19  2:31 PM   Specimen: Nasopharyngeal Swab  Result Value Ref Range Status   SARS Coronavirus 2 NEGATIVE NEGATIVE Final    Comment: (NOTE) SARS-CoV-2 target nucleic acids are NOT DETECTED.  The SARS-CoV-2 RNA is generally detectable in upper and lower respiratory specimens during the acute phase of infection. The lowest concentration of SARS-CoV-2 viral copies this assay can detect is 250 copies / mL. A negative result does not preclude SARS-CoV-2 infection and should not be used as the sole basis for treatment or other patient management decisions.  A negative result may occur  with improper specimen collection / handling, submission of specimen other than nasopharyngeal swab, presence of viral mutation(s) within the areas targeted by this assay, and inadequate number of viral copies (<250 copies / mL). A negative result must be combined with clinical observations, patient history, and epidemiological information.  Fact Sheet for Patients:   StrictlyIdeas.no  Fact Sheet for Healthcare Providers: BankingDealers.co.za  This test is not yet approved or  cleared by the [place] FDA and has been authorized for detection and/or diagnosis of SARS-CoV-2 by FDA under an Emergency Use Authorization (EUA).  This EUA will remain in effect (meaning this test can be used) for the duration of the COVID-19 declaration under Section 564(b)(1) of the Act, 21 U.S.C. section 360bbb-3(b)(1), unless the authorization is terminated or revoked sooner.  Performed at [facility], [address], [place], [place] [address]       Radiology Studies: DG Abd Portable 1V  Result Date: 08/03/2019 CLINICAL DATA:  Follow-up bowel obstruction. EXAM: PORTABLE ABDOMEN - 1 VIEW COMPARISON:  08/02/2019 FINDINGS: There is a nasogastric tube within the upper abdomen in the expected location of the distal stomach. Contrast opacification of the dilated small bowel loops again noted. Compared with the previous exam there is been mild interval improvement in the degree of small-bowel dilatation. Enteric contrast material is now noted within the colon at the level of the hepatic flexure. IMPRESSION: Mild interval improvement in small bowel obstruction pattern. Enteric contrast material is now noted within the colon at the level of the hepatic flexure. Electronically Signed   By: Kerby Moors M.D.   On: 08/03/2019 10:27   DG Abd Portable 1V-Small Bowel Obstruction Protocol-initial, 8 hr delay  Result Date: 08/02/2019 CLINICAL DATA:   Small-bowel obstruction EXAM: PORTABLE ABDOMEN - 1 VIEW COMPARISON:  11:30 a.m. FINDINGS: Nasogastric tube overlies the mid body of the stomach. Numerous dilated gas and contrast filled loops of small bowel are seen throughout the abdomen in keeping with a distal small-bowel obstruction. The degree of small-bowel dilation appears unchanged from prior examination. The large bowel appears decompressed. No gross free intraperitoneal gas. No other changes identified. IMPRESSION: Stable findings of distal small bowel obstruction. Contrast from prior CT examination has now progressed into the distal small bowel, but  has not yet passed into the colon. Electronically Signed   By: Fidela Salisbury MD   On: 08/02/2019 22:49     LOS: 3 days   Antonieta Pert, MD [facility]  08/04/2019, 2:25 PM

## 2019-08-05 ENCOUNTER — Inpatient Hospital Stay (HOSPITAL_COMMUNITY): Payer: Medicare Other

## 2019-08-05 LAB — BASIC METABOLIC PANEL
Anion gap: 15 (ref 5–15)
BUN: 21 mg/dL (ref 8–23)
CO2: 22 mmol/L (ref 22–32)
Calcium: 8.7 mg/dL — ABNORMAL LOW (ref 8.9–10.3)
Chloride: 103 mmol/L (ref 98–111)
Creatinine, Ser: 0.74 mg/dL (ref 0.44–1.00)
GFR calc Af Amer: >60 mL/min (ref >60)
GFR calc non Af Amer: >60 mL/min (ref >60)
Glucose, Bld: 90 mg/dL (ref 70–99)
Potassium: 3.1 mmol/L — ABNORMAL LOW (ref 3.5–5.1)
Sodium: 140 mmol/L (ref 135–145)

## 2019-08-05 LAB — GLUCOSE, CAPILLARY
Glucose-Capillary: 91 mg/dL (ref 70–99)
Glucose-Capillary: 85 mg/dL (ref 70–99)
Glucose-Capillary: 189 mg/dL — ABNORMAL HIGH (ref 70–99)

## 2019-08-05 LAB — CBC
HCT: 29.4 % — ABNORMAL LOW (ref 36.0–46.0)
Hemoglobin: 8.9 g/dL — ABNORMAL LOW (ref 12.0–15.0)
MCH: 25.6 pg — ABNORMAL LOW (ref 26.0–34.0)
MCHC: 30.3 g/dL (ref 30.0–36.0)
MCV: 84.7 fL (ref 80.0–100.0)
Platelets: 370 10*3/uL (ref 150–400)
RBC: 3.47 MIL/uL — ABNORMAL LOW (ref 3.87–5.11)
RDW: 14 % (ref 11.5–15.5)
WBC: 11.4 10*3/uL — ABNORMAL HIGH (ref 4.0–10.5)
nRBC: 0 % (ref 0.0–0.2)

## 2019-08-05 MED ORDER — POTASSIUM CHLORIDE CRYS ER 20 MEQ PO TBCR
40.0000 meq | EXTENDED_RELEASE_TABLET | Freq: Once | ORAL | Status: AC
  Administered 2019-08-05: 40 meq via ORAL
  Filled 2019-08-05: qty 2

## 2019-08-05 MED ORDER — POTASSIUM CHLORIDE CRYS ER 20 MEQ PO TBCR
20.0000 meq | EXTENDED_RELEASE_TABLET | Freq: Once | ORAL | Status: AC
  Administered 2019-08-05: 20 meq via ORAL
  Filled 2019-08-05: qty 1

## 2019-08-05 NOTE — Progress Notes (Signed)
Assessment & Plan: HD#5 - SBO - likely related to adhesive disease from past abdominal surgeries (hysterectomy, open appy) - SB protocol >> KUB  with dilated small bowel contrast to R colon; AXR this AM with contrast in distal colon but persistent dilated loops of small bowel - clinically improved with multiple BM's overnight - wants to eat - continue NG tube clamped, and begin clear liquid diet today  - encourage OOB, activity  Surgery will follow.  Repeat AXR in AM 7/19.        [name], MD       [facility]       Office: [phone]   Chief Complaint: Small bowel obstruction  Subjective: Patient in bed, comfortable.  Denies pain or nausea.  Objective: Vital signs in last 24 hours: Temp:  [97.9 F (36.6 C)-98.4 F (36.9 C)] 98.4 F (36.9 C) (07/18 0517) Pulse Rate:  [83-104] 83 (07/18 0517) Resp:  [16-18] 16 (07/18 0517) BP: (158-165)/(71-81) 159/71 (07/18 0517) SpO2:  [99 %-100 %] 100 % (07/18 0517) Last BM Date: 08/04/19  Intake/Output from previous day: 07/17 0701 - 07/18 0700 In: 1301.7 [P.O.:30; I.V.:1271.7] Out: 200 [Emesis/NG output:200] Intake/Output this shift: No intake/output data recorded.  Physical Exam: HEENT - sclerae clear, mucous membranes moist Neck - soft Chest - clear bilaterally Cor - RRR Abdomen - soft with mild distension; non-tender; NG clamped Ext - no edema, non-tender Neuro - alert & oriented, no focal deficits  Lab Results:  Recent Labs    08/04/19 0543 08/05/19 0607  WBC 10.9* 11.4*  HGB 8.2* 8.9*  HCT 28.3* 29.4*  PLT 354 370   BMET Recent Labs    08/04/19 0543 08/05/19 0607  NA 141 140  K 4.1 3.1*  CL 107 103  CO2 24 22  GLUCOSE 92 90  BUN 31* 21  CREATININE 0.69 0.74  CALCIUM 8.3* 8.7*   PT/INR No results for input(s): LABPROT, INR in the last 72 hours. Comprehensive Metabolic Panel:    Component Value Date/Time   NA 140 08/05/2019 0607   NA 141 08/04/2019 0543   NA 143  09/03/2013 0433   NA 141 09/01/2013 2320   K 3.1 (L) 08/05/2019 0607   K 4.1 08/04/2019 0543   K 3.9 09/03/2013 0433   K 3.5 09/01/2013 2320   CL 103 08/05/2019 0607   CL 107 08/04/2019 0543   CL 112 (H) 09/03/2013 0433   CL 103 09/01/2013 2320   CO2 22 08/05/2019 0607   CO2 24 08/04/2019 0543   CO2 22 09/03/2013 0433   CO2 25 09/01/2013 2320   BUN 21 08/05/2019 0607   BUN 31 (H) 08/04/2019 0543   BUN 11 09/03/2013 0433   BUN 23 (H) 09/01/2013 2320   CREATININE 0.74 08/05/2019 0607   CREATININE 0.69 08/04/2019 0543   CREATININE 1.04 09/03/2013 0433   CREATININE 1.58 (H) 09/01/2013 2320   GLUCOSE 90 08/05/2019 0607   GLUCOSE 92 08/04/2019 0543   GLUCOSE 124 (H) 09/03/2013 0433   GLUCOSE 215 (H) 09/01/2013 2320   CALCIUM 8.7 (L) 08/05/2019 0607   CALCIUM 8.3 (L) 08/04/2019 0543   CALCIUM 8.1 (L) 09/03/2013 0433   CALCIUM 9.7 09/01/2013 2320   AST 12 (L) 08/02/2019 0027   AST 20 08/01/2019 1431   AST 39 (H) 09/01/2013 2320   ALT 10 08/02/2019 0027   ALT 10 08/01/2019 1431   ALT 28 09/01/2013 2320   ALKPHOS 63 08/02/2019 0027  ALKPHOS 69 08/01/2019 1431   ALKPHOS 72 09/01/2013 2320   BILITOT 0.7 08/02/2019 0027   BILITOT 0.5 08/01/2019 1431   BILITOT 0.4 09/01/2013 2320   PROT 7.2 08/02/2019 0027   PROT 7.7 08/01/2019 1431   PROT 8.1 09/01/2013 2320   ALBUMIN 3.4 (L) 08/02/2019 0027   ALBUMIN 3.6 08/01/2019 1431   ALBUMIN 3.9 09/01/2013 2320    Studies/Results: No results found.    [name] 08/05/2019  Patient ID: Amber Hunter, female   DOB: 1928/08/21, 86 y.o.   MRN: [id number]

## 2019-08-05 NOTE — Progress Notes (Signed)
PROGRESS NOTE    Amber Hunter  ZOX:[id number] DOB: [birth year] DOA: 08/01/2019 PCP: Delrae Alfred, DO   Chef Complaints: Nausea vomiting abdominal pain x3 days  Brief Narrative: 86 y.o. female with history of hypertension, chronic kidney disease stage III, breast cancer in remission, restless leg syndrome, chronic pain on tramadol presents to the ER at [facility] with complaints of nausea vomiting abdominal pain for last 3 days.  Has not moved her bowels for the same time.  Pain is generalized and constant.  Denies fever chills chest pain or shortness of breath.  Denies any blood in the vomitus.  ED Course: In the ER CT abdomen pelvis shows features concerning for partial small bowel obstruction.  On-call general surgeon was consulted and patient admitted for further management of partial small bowel obstruction.  Labs are significant for elevated lactic acid which improved with fluids.  Also seen is hyperglycemia elevated creatinine hemoglobin of 10.4 mild leukocytosis Covid test was negative.  EKG was showing sinus tachycardia with LBBB  Patient was seen by surgery, managed conservatively with NG tube decompression.  Subjective:  Denies any abdominal pain reports passing flatus moving bowel.   Nasogastric tube has been clamped no nausea vomiting or abdominal pain.  Assessment & Plan:  Small bowel obstruction likely from adhesive disease due to past abdominal surgeries hysterectomy/appendicectomy: Tolerating NG tube clamped.  Per surgery continue to clamp the NG tube and start clear liquid diet, increase activity ambulate, repeat x-ray in the morning   Essential hypertension on lisinopril HCTZ at home: BP borderline controlled Home meds remains on hold.  Continue as needed IV medication.  Probably can resume p.o. soon.  AKI on CKD IIIa : 2/2 Bowel obstruction.  Creatinine and BUN improved.  Starting clear liquid, cut down IV fluids Recent Labs  Lab 08/01/19 1431  08/02/19 0027 08/03/19 0439 08/04/19 0543 08/05/19 0607  BUN 53* 52* 45* 31* 21  CREATININE 1.60* 1.48* 0.97 0.69 0.74   Hypokalemia potassium low at 3.1.  Will replace today. Recent Labs  Lab 08/01/19 1431 08/02/19 0027 08/03/19 0439 08/04/19 0543 08/05/19 0607  K 3.7 4.0 3.2* 4.1 3.1*   Lactic acidosis up to 5.0 likely from volume depletion and bowel obstruction, lactic acidosis has resolved.   Hyperglycemia. likely from stress. Prediabetes on hba1c.  Monitor blood sugar intermittently.  Normocytic anemia: Hemoglobin has been holding stable in 8 g range.  8.9 this morning.  Monitor closely.  Likely hemodilution, no obvious bleeding.  Recent Labs  Lab 08/01/19 1431 08/02/19 0027 08/03/19 0439 08/04/19 0543 08/05/19 0607  HGB 10.4* 9.5* 8.4* 8.2* 8.9*  HCT 34.3* 31.4* 27.8* 28.3* 29.4*   Hx of breast ca in remission  RLS hx p.o. meds on hold  DVT prophylaxis: heparin injection 5,000 Units Start: 08/02/19 1100 SCDs Start: 08/01/19 2349.   Code Status: Full code Family Communication: plan of care discussed with patient at bedside. I called her son and updated today.  Status is: Inpatient  Remains inpatient appropriate because:IV treatments appropriate due to intensity of illness or inability to take PO and Inpatient level of care appropriate due to severity of illness Remains hospitalized due to ongoing small bowel obstruction.  Dispo: The patient is from: Home              Anticipated d/c is to: Home.PT recommending home health PT.               Anticipated d/c date is: 2 days  Patient currently is not medically stable to d/c.  Discharge once cleared was surgery and tolerating diet  Diet Order            Diet clear liquid Room service appropriate? Yes; Fluid consistency: Thin  Diet effective now               Body mass index is 25.15 kg/m.  Consultants:see note  Procedures:see note Microbiology:see note  Medications: Scheduled Meds: .  heparin injection (subcutaneous)  5,000 Units Subcutaneous Q8H  . pneumococcal 23 valent vaccine  0.5 mL Intramuscular Tomorrow-1000  . sodium chloride flush  3 mL Intravenous Once   Continuous Infusions: . lactated ringers 75 mL/hr at 08/05/19 0600    Antimicrobials: Anti-infectives (From admission, onward)   None       Objective: Vitals: Today's Vitals   08/04/19 2027 08/04/19 2100 08/04/19 2142 08/05/19 0517  BP:   (!) 165/81 (!) 159/71  Pulse:   92 83  Resp:   16 16  Temp:   97.9 F (36.6 C) 98.4 F (36.9 C)  TempSrc:   Oral Oral  SpO2:   100% 100%  Weight:      Height:      PainSc: 5  2       Intake/Output Summary (Last 24 hours) at 08/05/2019 1225 Last data filed at 08/05/2019 0600 Gross per 24 hour  Intake 1044.47 ml  Output --  Net 1044.47 ml   Filed Weights   08/01/19 1405  Weight: 64.4 kg   Weight change:    Intake/Output from previous day: 07/17 0701 - 07/18 0700 In: 1301.7 [P.O.:30; I.V.:1271.7] Out: 200 [Emesis/NG output:200] Intake/Output this shift: No intake/output data recorded.  Examination: General exam: AAOx3, NGT+,NAD,weak appearing. HEENT:Oral mucosa moist, Ear/Nose WNL grossly, dentition normal. Respiratory system: bilaterally clear,no wheezing or crackles,no use of accessory muscle Cardiovascular system: S1 & S2 +,No JVD,. Gastrointestinal system: Abdomen soft,NT,ND, BS+ Nervous System:Alert, awake,moving extremities and grossly nonfocal Extremities: No edema, distal peripheral pulses palpable.  Skin: No rashes,no icterus. MSK: Normal muscle bulk,tone, power  Data Reviewed: I have personally reviewed following labs and imaging studies CBC: Recent Labs  Lab 08/01/19 1431 08/02/19 0027 08/03/19 0439 08/04/19 0543 08/05/19 0607  WBC 11.4* 9.2 8.0 10.9* 11.4*  HGB 10.4* 9.5* 8.4* 8.2* 8.9*  HCT 34.3* 31.4* 27.8* 28.3* 29.4*  MCV 83.7 84.0 84.0 87.1 84.7  PLT 510* 423* 393 354 716   Basic Metabolic Panel: Recent Labs   Lab 08/01/19 1431 08/02/19 0027 08/03/19 0439 08/04/19 0543 08/05/19 0607  NA 136 140 143 141 140  K 3.7 4.0 3.2* 4.1 3.1*  CL 95* 98 104 107 103  CO2 23 30 27 24 22   GLUCOSE 214* 141* 120* 92 90  BUN 53* 52* 45* 31* 21  CREATININE 1.60* 1.48* 0.97 0.69 0.74  CALCIUM 9.2 8.9 8.7* 8.3* 8.7*   GFR: Estimated Creatinine Clearance: 43 mL/min (by C-G formula based on SCr of 0.74 mg/dL). Liver Function Tests: Recent Labs  Lab 08/01/19 1431 08/02/19 0027  AST 20 12*  ALT 10 10  ALKPHOS 69 63  BILITOT 0.5 0.7  PROT 7.7 7.2  ALBUMIN 3.6 3.4*   Recent Labs  Lab 08/01/19 1431  LIPASE 17   No results for input(s): AMMONIA in the last 168 hours. Coagulation Profile: No results for input(s): INR, PROTIME in the last 168 hours. Cardiac Enzymes: No results for input(s): CKTOTAL, CKMB, CKMBINDEX, TROPONINI in the last 168 hours. BNP (last 3 results) No results for  input(s): PROBNP in the last 8760 hours. HbA1C: No results for input(s): HGBA1C in the last 72 hours. CBG: Recent Labs  Lab 08/03/19 1830 08/04/19 0358 08/04/19 0758 08/05/19 0058 08/05/19 0750  GLUCAP 101* 84 86 91 85   Lipid Profile: No results for input(s): CHOL, HDL, LDLCALC, TRIG, CHOLHDL, LDLDIRECT in the last 72 hours. Thyroid Function Tests: No results for input(s): TSH, T4TOTAL, FREET4, T3FREE, THYROIDAB in the last 72 hours. Anemia Panel: No results for input(s): VITAMINB12, FOLATE, FERRITIN, TIBC, IRON, RETICCTPCT in the last 72 hours. Sepsis Labs: Recent Labs  Lab 08/01/19 1431 08/01/19 1649 08/02/19 0027 08/02/19 0424  LATICACIDVEN 5.0* 2.4* 1.1 0.9    Recent Results (from the past 240 hour(s))  SARS Coronavirus 2 by RT PCR (hospital order, performed in [facility] hospital lab) Nasopharyngeal Nasopharyngeal Swab     Status: None   Collection Time: 08/01/19  2:31 PM   Specimen: Nasopharyngeal Swab  Result Value Ref Range Status   SARS Coronavirus 2 NEGATIVE NEGATIVE Final    Comment:  (NOTE) SARS-CoV-2 target nucleic acids are NOT DETECTED.  The SARS-CoV-2 RNA is generally detectable in upper and lower respiratory specimens during the acute phase of infection. The lowest concentration of SARS-CoV-2 viral copies this assay can detect is 250 copies / mL. A negative result does not preclude SARS-CoV-2 infection and should not be used as the sole basis for treatment or other patient management decisions.  A negative result may occur with improper specimen collection / handling, submission of specimen other than nasopharyngeal swab, presence of viral mutation(s) within the areas targeted by this assay, and inadequate number of viral copies (<250 copies / mL). A negative result must be combined with clinical observations, patient history, and epidemiological information.  Fact Sheet for Patients:   StrictlyIdeas.no  Fact Sheet for Healthcare Providers: BankingDealers.co.za  This test is not yet approved or  cleared by the [place] FDA and has been authorized for detection and/or diagnosis of SARS-CoV-2 by FDA under an Emergency Use Authorization (EUA).  This EUA will remain in effect (meaning this test can be used) for the duration of the COVID-19 declaration under Section 564(b)(1) of the Act, 21 U.S.C. section 360bbb-3(b)(1), unless the authorization is terminated or revoked sooner.  Performed at [facility], [address] [place],  [address]       Radiology Studies: No results found.   LOS: 4 days   Antonieta Pert, MD [facility]  08/05/2019, 12:25 PM

## 2019-08-06 ENCOUNTER — Inpatient Hospital Stay (HOSPITAL_COMMUNITY): Payer: Medicare Other

## 2019-08-06 LAB — CBC
HCT: 28 % — ABNORMAL LOW (ref 36.0–46.0)
Hemoglobin: 8.7 g/dL — ABNORMAL LOW (ref 12.0–15.0)
MCH: 25.4 pg — ABNORMAL LOW (ref 26.0–34.0)
MCHC: 31.1 g/dL (ref 30.0–36.0)
MCV: 81.9 fL (ref 80.0–100.0)
Platelets: 382 10*3/uL (ref 150–400)
RBC: 3.42 MIL/uL — ABNORMAL LOW (ref 3.87–5.11)
RDW: 13.9 % (ref 11.5–15.5)
WBC: 13.6 10*3/uL — ABNORMAL HIGH (ref 4.0–10.5)
nRBC: 0 % (ref 0.0–0.2)

## 2019-08-06 LAB — BASIC METABOLIC PANEL
Anion gap: 12 (ref 5–15)
Anion gap: 10 (ref 5–15)
BUN: 16 mg/dL (ref 8–23)
BUN: 15 mg/dL (ref 8–23)
CO2: 24 mmol/L (ref 22–32)
CO2: 26 mmol/L (ref 22–32)
Calcium: 8.7 mg/dL — ABNORMAL LOW (ref 8.9–10.3)
Calcium: 8.6 mg/dL — ABNORMAL LOW (ref 8.9–10.3)
Chloride: 101 mmol/L (ref 98–111)
Chloride: 102 mmol/L (ref 98–111)
Creatinine, Ser: 0.69 mg/dL (ref 0.44–1.00)
Creatinine, Ser: 0.72 mg/dL (ref 0.44–1.00)
GFR calc Af Amer: >60 mL/min (ref >60)
GFR calc Af Amer: >60 mL/min (ref >60)
GFR calc non Af Amer: >60 mL/min (ref >60)
GFR calc non Af Amer: >60 mL/min (ref >60)
Glucose, Bld: 119 mg/dL — ABNORMAL HIGH (ref 70–99)
Glucose, Bld: 109 mg/dL — ABNORMAL HIGH (ref 70–99)
Potassium: 3 mmol/L — ABNORMAL LOW (ref 3.5–5.1)
Potassium: 3.6 mmol/L (ref 3.5–5.1)
Sodium: 137 mmol/L (ref 135–145)
Sodium: 138 mmol/L (ref 135–145)

## 2019-08-06 LAB — GLUCOSE, CAPILLARY
Glucose-Capillary: 120 mg/dL — ABNORMAL HIGH (ref 70–99)
Glucose-Capillary: 109 mg/dL — ABNORMAL HIGH (ref 70–99)

## 2019-08-06 MED ORDER — POTASSIUM CHLORIDE 10 MEQ/100ML IV SOLN
10.0000 meq | INTRAVENOUS | Status: AC
  Administered 2019-08-06 (×2): 10 meq via INTRAVENOUS
  Filled 2019-08-06 (×2): qty 100

## 2019-08-06 MED ORDER — POTASSIUM CHLORIDE CRYS ER 20 MEQ PO TBCR
40.0000 meq | EXTENDED_RELEASE_TABLET | Freq: Once | ORAL | Status: AC
  Administered 2019-08-06: 40 meq via ORAL
  Filled 2019-08-06: qty 2

## 2019-08-06 NOTE — Progress Notes (Addendum)
Subjective: Doing well.  NGT out and tolerating her liquids.  She is passing flatus.  No nausea since NGT removed  ROS: See above, otherwise other systems negative  Objective: Vital signs in last 24 hours: Temp:  [97.8 F (36.6 C)-98.1 F (36.7 C)] 98.1 F (36.7 C) (07/19 0523) Pulse Rate:  [85-133] 94 (07/19 0523) Resp:  [15-18] 16 (07/19 0523) BP: (152-165)/(88-109) 156/92 (07/19 0523) SpO2:  [96 %-99 %] 96 % (07/19 0523) Last BM Date: 08/05/19  Intake/Output from previous day: 07/18 0701 - 07/19 0700 In: 1665.3 [P.O.:630; I.V.:1035.3] Out: 0  Intake/Output this shift: No intake/output data recorded.  PE: Abd: soft, NT, Nd, +BS  Lab Results:  Recent Labs    08/05/19 0607 08/06/19 0534  WBC 11.4* 13.6*  HGB 8.9* 8.7*  HCT 29.4* 28.0*  PLT 370 382   BMET Recent Labs    08/05/19 0607 08/06/19 0534  NA 140 137  K 3.1* 3.0*  CL 103 101  CO2 22 24  GLUCOSE 90 119*  BUN 21 16  CREATININE 0.74 0.69  CALCIUM 8.7* 8.7*   PT/INR No results for input(s): LABPROT, INR in the last 72 hours. CMP     Component Value Date/Time   NA 137 08/06/2019 0534   NA 143 09/03/2013 0433   K 3.0 (L) 08/06/2019 0534   K 3.9 09/03/2013 0433   CL 101 08/06/2019 0534   CL 112 (H) 09/03/2013 0433   CO2 24 08/06/2019 0534   CO2 22 09/03/2013 0433   GLUCOSE 119 (H) 08/06/2019 0534   GLUCOSE 124 (H) 09/03/2013 0433   BUN 16 08/06/2019 0534   BUN 11 09/03/2013 0433   CREATININE 0.69 08/06/2019 0534   CREATININE 1.04 09/03/2013 0433   CALCIUM 8.7 (L) 08/06/2019 0534   CALCIUM 8.1 (L) 09/03/2013 0433   PROT 7.2 08/02/2019 0027   PROT 8.1 09/01/2013 2320   ALBUMIN 3.4 (L) 08/02/2019 0027   ALBUMIN 3.9 09/01/2013 2320   AST 12 (L) 08/02/2019 0027   AST 39 (H) 09/01/2013 2320   ALT 10 08/02/2019 0027   ALT 28 09/01/2013 2320   ALKPHOS 63 08/02/2019 0027   ALKPHOS 72 09/01/2013 2320   BILITOT 0.7 08/02/2019 0027   BILITOT 0.4 09/01/2013 2320   GFRNONAA >60  08/06/2019 0534   GFRNONAA 50 (L) 09/03/2013 0433   GFRAA >60 08/06/2019 0534   GFRAA 58 (L) 09/03/2013 0433   Lipase     Component Value Date/Time   LIPASE 17 08/01/2019 1431   LIPASE 146 09/01/2013 2320       Studies/Results: DG Abd 1 View  Result Date: 08/05/2019 CLINICAL DATA:  Small bowel obstruction. EXAM: ABDOMEN - 1 VIEW COMPARISON:  August 03, 2019. FINDINGS: Mildly dilated small bowel loops are noted concerning for small bowel obstruction. No colonic dilatation is noted. Residual contrast is noted in the distal sigmoid colon and rectum. IMPRESSION: Mildly dilated small bowel loops are noted concerning for small bowel obstruction. Electronically Signed   By: Marijo Conception M.D.   On: 08/05/2019 12:54   DG Abd 2 Views  Result Date: 08/06/2019 CLINICAL DATA:  Small-bowel obstruction, abdominal pain EXAM: ABDOMEN - 2 VIEW COMPARISON:  08/05/2019 FINDINGS: Air distended loops of small bowel within the mid abdomen measuring approximately 3 cm in diameter, slightly decreased from prior study. There is air present within the large bowel. No significant residual enteric contrast is seen within the abdomen. No gross free intraperitoneal air on semi  upright view. Thoracolumbar levo scoliotic curvature. IMPRESSION: 1. Improving degree of small-bowel dilatation suggestive of resolving small bowel obstruction. Continued radiographic surveillance is suggested. 2. No significant residual enteric contrast within the abdomen. Electronically Signed   By: Davina Poke D.O.   On: 08/06/2019 09:21    Anti-infectives: Anti-infectives (From admission, onward)   None       Assessment/Plan CKD, stage III H/O breast cancer HTN RLS Hiatal hernia  SBO  -resolved -adv to soft diet today -once she tolerates this, she may go home from our standpoint.  D/W primary service -mobilize, pulm toilet/IS -no surgical follow up needed  FEN - soft diet VTE - heparin ID - none   LOS: 5 days    Amber Hunter , [facility] 08/06/2019, 10:35 AM Please see Amion for pager number during day hours 7:00am-4:30pm or 7:00am -11:30am on weekends  Agree with above. She is better.  Alphonsa Overall, MD, [facility] Office phone:  [phone]

## 2019-08-06 NOTE — Progress Notes (Signed)
PROGRESS NOTE    Amber Hunter  JAS:[id number] DOB: 1928-07-05 DOA: 08/01/2019 PCP: Delrae Alfred, DO   Chef Complaints: Nausea vomiting abdominal pain x3 days  Brief Narrative: 86 y.o.femalewithhistory of hypertension, chronic kidney disease stage III, breast cancer in remission, restless leg syndrome, chronic pain on tramadol presents to the ER at [facility] with complaints of nausea vomiting abdominal pain for last 3 days. Has not moved her bowels for the same time. Pain is generalized and constant. Denies fever chills chest pain or shortness of breath. Denies any blood in the vomitus.  ED Course:In the ER CT abdomen pelvis shows features concerning for partial small bowel obstruction. On-call general surgeon was consulted and patient admitted for further management of partial small bowel obstruction. Labs are significant for elevated lactic acid which improved with fluids. Also seen is hyperglycemia elevated creatinine hemoglobin of 10.4 mild leukocytosis Covid test was negative. EKG was showing sinus tachycardia with LBBB  Patient was seen by surgery, managed conservatively with NG tube decompression. Initially with NG tube decompression IV fluids n.p.o. subsequently diet was slowly advanced after clamping the NG tube and it was removed.  At this time off NG tube, tolerating diet, advanced to soft diet today  Subjective:  seen this morning reevaluated this afternoon she has not had her soft diet but was laying on the table.  Appears weak and deconditioned.  Assessment & Plan: Small bowel obstruction likely from adhesive disease due to past abdominal surgeries hysterectomy/appendicectomy:  Resolved clinically.  X-ray showing improving degree of small bowel dilation. Soft diet today.  I checked on her this afternoon patient has not eaten her lunch did not realize she had a lunch on the table.  Encouraged her to take her diet   Essential hypertension on lisinopril HCTZ  at home: Stable.  She will resume her home meds upon discharge.    AKI on CKD IIIa : 2/2 Bowel obstruction.   Creatinine is improved at 0.6.  Hypokalemia 3.0-replaced.Recheck labs are improved.    Leukocytosis: WBC fluctuating at 13 point 6K today but she is afebrile no evidence of infection.  No respiratory symptoms.Check UA  Lactic acidosis up to 5.0 likely from volume depletion and bowel obstruction, lactic acidosis has resolved.   Hyperglycemia. likely from stress. Prediabetes on hba1c.  Monitor blood sugar intermittently.  Normocytic anemia: Hemoglobin has been holding stable in 8 g range.  Check CBC in a.m.  no obvious bleeding.  Recent Labs  Lab 08/02/19 0027 08/03/19 0439 08/04/19 0543 08/05/19 0607 08/06/19 0534  HGB 9.5* 8.4* 8.2* 8.9* 8.7*  HCT 31.4* 27.8* 28.3* 29.4* 28.0*   Hx of breast ca in remission  RLS hx- resume home meds  DVT prophylaxis: heparin injection 5,000 Units Start: 08/02/19 1100 SCDs Start: 08/01/19 2349.   Code Status: Full code Family Communication: plan of care discussed with patient at bedside. I had called her son and updated 7/18  Status is: Inpatient  Remains inpatient appropriate because:IV treatments appropriate due to intensity of illness or inability to take PO and Inpatient level of care appropriate due to severity of illness Remains hospitalized due to ongoing small bowel obstruction.  Dispo: The patient is from: Home              Anticipated d/c is to: Home.PT recommending home health PT.               Anticipated d/c date is: 1 day  Patient currently is not medically stable to d/c.  Anticipate discharge tomorrow if tolerating diet and labs stable  Diet Order            DIET SOFT Room service appropriate? Yes; Fluid consistency: Thin  Diet effective now               Body mass index is 25.15 kg/m.  Consultants:see note  Procedures:see note Microbiology:see note  Medications: Scheduled Meds: . heparin  injection (subcutaneous)  5,000 Units Subcutaneous Q8H  . pneumococcal 23 valent vaccine  0.5 mL Intramuscular Tomorrow-1000  . sodium chloride flush  3 mL Intravenous Once   Continuous Infusions: . lactated ringers 50 mL/hr at 08/06/19 0416    Antimicrobials: Anti-infectives (From admission, onward)   None       Objective: Vitals: Today's Vitals   08/05/19 2112 08/06/19 0523 08/06/19 0910 08/06/19 1350  BP: (!) 154/89 (!) 156/92  (!) 143/99  Pulse: (!) 107 94  (!) 110  Resp: 15 16  14   Temp: 98 F (36.7 C) 98.1 F (36.7 C)  97.7 F (36.5 C)  TempSrc: Oral Oral  Oral  SpO2: 98% 96%  99%  Weight:      Height:      PainSc:   Asleep     Intake/Output Summary (Last 24 hours) at 08/06/2019 1705 Last data filed at 08/06/2019 1431 Gross per 24 hour  Intake 1873.96 ml  Output 0 ml  Net 1873.96 ml   Filed Weights   08/01/19 1405  Weight: 64.4 kg   Weight change:    Intake/Output from previous day: 07/18 0701 - 07/19 0700 In: 1665.3 [P.O.:630; I.V.:1035.3] Out: 0  Intake/Output this shift: Total I/O In: 844.5 [P.O.:220; I.V.:621; IV Piggyback:3.5] Out: 0   Examination: General exam: AAOx3, weak, frail, NAD, weak appearing. HEENT:Oral mucosa moist, Ear/Nose WNL grossly, dentition normal. Respiratory system: bilaterally clear,no wheezing or crackles,no use of accessory muscle Cardiovascular system: S1 & S2 +, No JVD,. Gastrointestinal system: Abdomen soft, NT,ND, BS+ Nervous System:Alert, awake, moving extremities and grossly nonfocal Extremities: No edema, distal peripheral pulses palpable.  Skin: No rashes,no icterus. MSK: Normal muscle bulk,tone, power  Data Reviewed: I have personally reviewed following labs and imaging studies CBC: Recent Labs  Lab 08/02/19 0027 08/03/19 0439 08/04/19 0543 08/05/19 0607 08/06/19 0534  WBC 9.2 8.0 10.9* 11.4* 13.6*  HGB 9.5* 8.4* 8.2* 8.9* 8.7*  HCT 31.4* 27.8* 28.3* 29.4* 28.0*  MCV 84.0 84.0 87.1 84.7 81.9  PLT  423* 393 354 370 932   Basic Metabolic Panel: Recent Labs  Lab 08/03/19 0439 08/04/19 0543 08/05/19 0607 08/06/19 0534 08/06/19 1613  NA 143 141 140 137 138  K 3.2* 4.1 3.1* 3.0* 3.6  CL 104 107 103 101 102  CO2 27 24 22 24 26   GLUCOSE 120* 92 90 119* 109*  BUN 45* 31* 21 16 15   CREATININE 0.97 0.69 0.74 0.69 0.72  CALCIUM 8.7* 8.3* 8.7* 8.7* 8.6*   GFR: Estimated Creatinine Clearance: 43 mL/min (by C-G formula based on SCr of 0.72 mg/dL). Liver Function Tests: Recent Labs  Lab 08/01/19 1431 08/02/19 0027  AST 20 12*  ALT 10 10  ALKPHOS 69 63  BILITOT 0.5 0.7  PROT 7.7 7.2  ALBUMIN 3.6 3.4*   Recent Labs  Lab 08/01/19 1431  LIPASE 17   No results for input(s): AMMONIA in the last 168 hours. Coagulation Profile: No results for input(s): INR, PROTIME in the last 168 hours. Cardiac Enzymes: No results for  input(s): CKTOTAL, CKMB, CKMBINDEX, TROPONINI in the last 168 hours. BNP (last 3 results) No results for input(s): PROBNP in the last 8760 hours. HbA1C: No results for input(s): HGBA1C in the last 72 hours. CBG: Recent Labs  Lab 08/05/19 0058 08/05/19 0750 08/05/19 1615 08/06/19 0741 08/06/19 1548  GLUCAP 91 85 189* 120* 109*   Lipid Profile: No results for input(s): CHOL, HDL, LDLCALC, TRIG, CHOLHDL, LDLDIRECT in the last 72 hours. Thyroid Function Tests: No results for input(s): TSH, T4TOTAL, FREET4, T3FREE, THYROIDAB in the last 72 hours. Anemia Panel: No results for input(s): VITAMINB12, FOLATE, FERRITIN, TIBC, IRON, RETICCTPCT in the last 72 hours. Sepsis Labs: Recent Labs  Lab 08/01/19 1431 08/01/19 1649 08/02/19 0027 08/02/19 0424  LATICACIDVEN 5.0* 2.4* 1.1 0.9    Recent Results (from the past 240 hour(s))  SARS Coronavirus 2 by RT PCR (hospital order, performed in [facility] hospital lab) Nasopharyngeal Nasopharyngeal Swab     Status: None   Collection Time: 08/01/19  2:31 PM   Specimen: Nasopharyngeal Swab  Result Value Ref Range  Status   SARS Coronavirus 2 NEGATIVE NEGATIVE Final    Comment: (NOTE) SARS-CoV-2 target nucleic acids are NOT DETECTED.  The SARS-CoV-2 RNA is generally detectable in upper and lower respiratory specimens during the acute phase of infection. The lowest concentration of SARS-CoV-2 viral copies this assay can detect is 250 copies / mL. A negative result does not preclude SARS-CoV-2 infection and should not be used as the sole basis for treatment or other patient management decisions.  A negative result may occur with improper specimen collection / handling, submission of specimen other than nasopharyngeal swab, presence of viral mutation(s) within the areas targeted by this assay, and inadequate number of viral copies (<250 copies / mL). A negative result must be combined with clinical observations, patient history, and epidemiological information.  Fact Sheet for Patients:   StrictlyIdeas.no  Fact Sheet for Healthcare Providers: BankingDealers.co.za  This test is not yet approved or  cleared by the [place] FDA and has been authorized for detection and/or diagnosis of SARS-CoV-2 by FDA under an Emergency Use Authorization (EUA).  This EUA will remain in effect (meaning this test can be used) for the duration of the COVID-19 declaration under Section 564(b)(1) of the Act, 21 U.S.C. section 360bbb-3(b)(1), unless the authorization is terminated or revoked sooner.  Performed at [facility], [address], [place], [place] [address]       Radiology Studies: DG Abd 1 View  Result Date: 08/05/2019 CLINICAL DATA:  Small bowel obstruction. EXAM: ABDOMEN - 1 VIEW COMPARISON:  August 03, 2019. FINDINGS: Mildly dilated small bowel loops are noted concerning for small bowel obstruction. No colonic dilatation is noted. Residual contrast is noted in the distal sigmoid colon and rectum. IMPRESSION: Mildly dilated small bowel loops  are noted concerning for small bowel obstruction. Electronically Signed   By: Marijo Conception M.D.   On: 08/05/2019 12:54   DG Abd 2 Views  Result Date: 08/06/2019 CLINICAL DATA:  Small-bowel obstruction, abdominal pain EXAM: ABDOMEN - 2 VIEW COMPARISON:  08/05/2019 FINDINGS: Air distended loops of small bowel within the mid abdomen measuring approximately 3 cm in diameter, slightly decreased from prior study. There is air present within the large bowel. No significant residual enteric contrast is seen within the abdomen. No gross free intraperitoneal air on semi upright view. Thoracolumbar levo scoliotic curvature. IMPRESSION: 1. Improving degree of small-bowel dilatation suggestive of resolving small bowel obstruction. Continued  radiographic surveillance is suggested. 2. No significant residual enteric contrast within the abdomen. Electronically Signed   By: Davina Poke D.O.   On: 08/06/2019 09:21     LOS: 5 days   Antonieta Pert, MD [facility]  08/06/2019, 5:05 PM

## 2019-08-06 NOTE — Progress Notes (Signed)
Physical Therapy Treatment Patient Details Name: Amber Hunter MRN: [id number] DOB: [birth year] Today's Date: 08/06/2019    History of Present Illness Pt is 86 y.o. female with history of hypertension, chronic kidney disease stage III, breast cancer in remission, restless leg syndrome, chronic pain on tramadol presents to the ER with complaints of nausea vomiting abdominal pain for last 3 days.  Pt admitted with SBO likely from adhesive disease.  no surgical intervention at this time.    PT Comments    Pt agreeable to OOB,  Incontinent of stool in depends/diaper, bed soaked through to fitted sheet.  Depends removed, assisted pt to [facility], pt assisted with her own peri-care, requiring min assist for balance and safety. Pt too fatigued for hallway amb   Follow Up Recommendations  Home health PT;Supervision for mobility/OOB     Equipment Recommendations  Rolling walker with 5" wheels (youth ht )    Recommendations for Other Services       Precautions / Restrictions Precautions Precautions: Fall Restrictions Weight Bearing Restrictions: No    Mobility  Bed Mobility Overal bed mobility: Needs Assistance Bed Mobility: Rolling;Sidelying to Sit Rolling: Modified independent (Device/Increase time) Sidelying to sit: Modified independent (Device/Increase time);HOB elevated       General bed mobility comments: heavy reliance on rail, incr time  Transfers Overall transfer level: Needs assistance Equipment used: 1 person hand held assist Transfers: Sit to/from [employer] Transfers Sit to Stand: Min assist;Min guard Stand pivot transfers: Min assist       General transfer comment: min assist to rise and steady, assist to balance during stand pivot  Ambulation/Gait Ambulation/Gait assistance: Min guard;Min assist Gait Distance (Feet): 6 Feet Assistive device: 1 person hand held assist Gait Pattern/deviations: Decreased stride length;Trunk flexed Gait velocity: decreased    General Gait Details: mild unsteadiness; fatigued easily; declined ambulation in hall stating she was too tired    Marine [employer] (Stroke Patients Only)       Balance               Standing balance comment: pt is briefly able to maintain static standing without  UE support                               Cognition Arousal/Alertness: Awake/alert Behavior During Therapy: WFL for tasks assessed/performed Overall Cognitive Status: Within Functional Limits for tasks assessed                                        Exercises      General Comments        Pertinent Vitals/Pain Pain Assessment: No/denies pain    Home Living                      Prior Function            PT Goals (current goals can now be found in the care plan section) Acute Rehab PT Goals Patient Stated Goal: return home PT Goal Formulation: With patient Time For Goal Achievement: 08/18/19 Potential to Achieve Goals: Good Progress towards PT goals: Progressing toward goals    Frequency    Min 3X/week      PT Plan Current plan remains appropriate  Co-evaluation              AM-PAC PT "6 Clicks" Mobility   Outcome Measure  Help needed turning from your back to your side while in a flat bed without using bedrails?: A Little Help needed moving from lying on your back to sitting on the side of a flat bed without using bedrails?: A Little Help needed moving to and from a bed to a chair (including a wheelchair)?: A Little Help needed standing up from a chair using your arms (e.g., wheelchair or bedside chair)?: A Little Help needed to walk in hospital room?: A Little Help needed climbing 3-5 steps with a railing? : A Little 6 Click Score: 18    End of Session Equipment Utilized During Treatment: Gait belt Activity Tolerance: Patient tolerated treatment well Patient left: in chair;with call  bell/phone within reach Nurse Communication: Mobility status PT Visit Diagnosis: Unsteadiness on feet (R26.81);Muscle weakness (generalized) (M62.81)     Time: 6168-3729 PT Time Calculation (min) (ACUTE ONLY): 21 min  Charges:  $Gait Training: 8-22 mins                     Baxter Flattery, PT  Acute Rehab Dept ([place]) [phone] Pager [phone]  08/06/2019    [facility] 08/06/2019, 12:20 PM

## 2019-08-07 ENCOUNTER — Inpatient Hospital Stay (HOSPITAL_COMMUNITY): Payer: Medicare Other

## 2019-08-07 LAB — COMPREHENSIVE METABOLIC PANEL
ALT: 24 U/L (ref 0–44)
AST: 35 U/L (ref 15–41)
Albumin: 3.3 g/dL — ABNORMAL LOW (ref 3.5–5.0)
Alkaline Phosphatase: 58 U/L (ref 38–126)
Anion gap: 12 (ref 5–15)
BUN: 27 mg/dL — ABNORMAL HIGH (ref 8–23)
CO2: 27 mmol/L (ref 22–32)
Calcium: 9.4 mg/dL (ref 8.9–10.3)
Chloride: 100 mmol/L (ref 98–111)
Creatinine, Ser: 1.01 mg/dL — ABNORMAL HIGH (ref 0.44–1.00)
GFR calc Af Amer: 57 mL/min — ABNORMAL LOW (ref >60)
GFR calc non Af Amer: 49 mL/min — ABNORMAL LOW (ref >60)
Glucose, Bld: 174 mg/dL — ABNORMAL HIGH (ref 70–99)
Potassium: 4 mmol/L (ref 3.5–5.1)
Sodium: 139 mmol/L (ref 135–145)
Total Bilirubin: 0.3 mg/dL (ref 0.3–1.2)
Total Protein: 6.8 g/dL (ref 6.5–8.1)

## 2019-08-07 LAB — URINALYSIS, ROUTINE W REFLEX MICROSCOPIC
Bilirubin Urine: NEGATIVE
Glucose, UA: NEGATIVE mg/dL
Ketones, ur: NEGATIVE mg/dL
Nitrite: POSITIVE — AB
Protein, ur: 30 mg/dL — AB
Specific Gravity, Urine: 1.017 (ref 1.005–1.030)
WBC, UA: >50 WBC/hpf — ABNORMAL HIGH (ref 0–5)
pH: 5 (ref 5.0–8.0)

## 2019-08-07 LAB — BASIC METABOLIC PANEL
Anion gap: 8 (ref 5–15)
BUN: 16 mg/dL (ref 8–23)
CO2: 26 mmol/L (ref 22–32)
Calcium: 8.9 mg/dL (ref 8.9–10.3)
Chloride: 104 mmol/L (ref 98–111)
Creatinine, Ser: 0.75 mg/dL (ref 0.44–1.00)
GFR calc Af Amer: >60 mL/min (ref >60)
GFR calc non Af Amer: >60 mL/min (ref >60)
Glucose, Bld: 116 mg/dL — ABNORMAL HIGH (ref 70–99)
Potassium: 3.5 mmol/L (ref 3.5–5.1)
Sodium: 138 mmol/L (ref 135–145)

## 2019-08-07 LAB — CBC
HCT: 27.3 % — ABNORMAL LOW (ref 36.0–46.0)
HCT: 30 % — ABNORMAL LOW (ref 36.0–46.0)
Hemoglobin: 8.2 g/dL — ABNORMAL LOW (ref 12.0–15.0)
Hemoglobin: 9.1 g/dL — ABNORMAL LOW (ref 12.0–15.0)
MCH: 25.2 pg — ABNORMAL LOW (ref 26.0–34.0)
MCH: 25.3 pg — ABNORMAL LOW (ref 26.0–34.0)
MCHC: 30 g/dL (ref 30.0–36.0)
MCHC: 30.3 g/dL (ref 30.0–36.0)
MCV: 83.7 fL (ref 80.0–100.0)
MCV: 83.3 fL (ref 80.0–100.0)
Platelets: 331 10*3/uL (ref 150–400)
Platelets: 437 10*3/uL — ABNORMAL HIGH (ref 150–400)
RBC: 3.26 MIL/uL — ABNORMAL LOW (ref 3.87–5.11)
RBC: 3.6 MIL/uL — ABNORMAL LOW (ref 3.87–5.11)
RDW: 14.1 % (ref 11.5–15.5)
RDW: 14.1 % (ref 11.5–15.5)
WBC: 13 10*3/uL — ABNORMAL HIGH (ref 4.0–10.5)
WBC: 17.3 10*3/uL — ABNORMAL HIGH (ref 4.0–10.5)
nRBC: 0.2 % (ref 0.0–0.2)
nRBC: 0 % (ref 0.0–0.2)

## 2019-08-07 LAB — TROPONIN I (HIGH SENSITIVITY)
Troponin I (High Sensitivity): 18 ng/L — ABNORMAL HIGH (ref <18)
Troponin I (High Sensitivity): 17 ng/L (ref <18)

## 2019-08-07 LAB — LACTIC ACID, PLASMA
Lactic Acid, Venous: 1.4 mmol/L (ref 0.5–1.9)
Lactic Acid, Venous: 1.7 mmol/L (ref 0.5–1.9)

## 2019-08-07 LAB — GLUCOSE, CAPILLARY
Glucose-Capillary: 113 mg/dL — ABNORMAL HIGH (ref 70–99)
Glucose-Capillary: 113 mg/dL — ABNORMAL HIGH (ref 70–99)
Glucose-Capillary: 149 mg/dL — ABNORMAL HIGH (ref 70–99)
Glucose-Capillary: 153 mg/dL — ABNORMAL HIGH (ref 70–99)

## 2019-08-07 LAB — D-DIMER, QUANTITATIVE: D-Dimer, Quant: 1.02 ug/mL-FEU — ABNORMAL HIGH (ref 0.00–0.50)

## 2019-08-07 MED ORDER — SODIUM CHLORIDE 0.9 % IV BOLUS: 500.0000 mL | Freq: Once | INTRAVENOUS | Status: DC

## 2019-08-07 MED ORDER — SODIUM CHLORIDE 0.9 % IV SOLN: INTRAVENOUS | Status: DC

## 2019-08-07 MED ORDER — SODIUM CHLORIDE 0.9 % IV BOLUS
250.0000 mL | Freq: Once | INTRAVENOUS | Status: AC
  Administered 2019-08-07: 250 mL via INTRAVENOUS

## 2019-08-07 MED ORDER — SODIUM CHLORIDE 0.9 % IV BOLUS
500.0000 mL | Freq: Once | INTRAVENOUS | Status: AC
  Administered 2019-08-07: 500 mL via INTRAVENOUS

## 2019-08-07 MED ORDER — SODIUM CHLORIDE 0.9 % IV BOLUS
1000.0000 mL | Freq: Once | INTRAVENOUS | Status: AC
  Administered 2019-08-07: 1000 mL via INTRAVENOUS

## 2019-08-07 MED ORDER — SODIUM CHLORIDE 0.9 % IV SOLN
1.0000 g | INTRAVENOUS | Status: DC
  Administered 2019-08-07 – 2019-08-09 (×3): 1 g via INTRAVENOUS
  Filled 2019-08-07 (×3): qty 1

## 2019-08-07 MED ORDER — FENTANYL CITRATE (PF) 100 MCG/2ML IJ SOLN
12.5000 ug | Freq: Once | INTRAMUSCULAR | Status: AC
  Administered 2019-08-07: 12.5 ug via INTRAVENOUS
  Filled 2019-08-07: qty 2

## 2019-08-07 NOTE — Progress Notes (Signed)
Patient vital done. Patient fired a yellow mews. Heart rate elevated. EKG done. Paged Dr. Antonieta Pert. MD called back wrote new orders and came saw the patient.

## 2019-08-07 NOTE — Progress Notes (Signed)
Patient IV came out,RN tried to stick the patient once but was unsecessful. Paged on call provider and report given to day shift nurse.

## 2019-08-07 NOTE — Discharge Summary (Deleted)
Physician Discharge Summary  Amber Hunter MAU:[id number] DOB: 1928/05/11 DOA: 08/01/2019  PCP: Delrae Alfred, DO  Admit date: 08/01/2019 Discharge date: 08/07/2019  Admitted From: Home Disposition:  [place]  Recommendations for Outpatient Follow-up:  1. Follow up with PCP in 1-2 weeks 2. Please obtain BMP/CBC in one week 3. Please follow up on the following pending results:  Home Health:PT  Equipment/Devices: None  Discharge Condition: Stable Code Status: FULL Diet recommendation:  Diet Order            Diet - low sodium heart healthy           DIET SOFT Room service appropriate? Yes; Fluid consistency: Thin  Diet effective now                  Brief/Interim Summary: 86 y.o.femalewithhistory of hypertension, chronic kidney disease stage III, breast cancer in remission, restless leg syndrome, chronic pain on tramadol presents to the ER at [facility] with complaints of nausea vomiting abdominal pain for last 3 days. Has not moved her bowels for the same time. Pain is generalized and constant. Denies fever chills chest pain or shortness of breath. Denies any blood in the vomitus.  ED Course:In the ER CT abdomen pelvis shows features concerning for partial small bowel obstruction. On-call general surgeon was consulted and patient admitted for further management of partial small bowel obstruction. Labs are significant for elevated lactic acid which improved with fluids. Also seen is hyperglycemia elevated creatinine hemoglobin of 10.4 mild leukocytosis Covid test was negative. EKG was showing sinus tachycardia with LBBB Patient was seen by surgery, managed conservatively with NG tube decompression. Initially with NG tube decompression IV fluids n.p.o. subsequently diet was slowly advanced after clamping the NG tube and it was removed.  At this time off NG tube, tolerating diet, advanced to soft diet 7/19 She tolerated diet.  Having bowel movement.  Seen by surgery  and signed off.  She is medically stable for discharge home.  Discharge Diagnoses:  Small bowel obstruction likely from adhesive disease due to previous history.  All obstruction has resolved.  Tolerating diet having bowel movement.  Seen by surgery and has signed off .Having bowel movement.  Ambulatory.  Will discharge home with outpatient follow-up with PCP  Essential hypertensiononlisinopril HCTZ at home: Stable.  She will resume her home meds upon discharge.    AKI on CKD IIIa :2/2Bowel obstruction.  Creatinine stable 0.7.  Continue oral hydration.. Recent Labs  Lab 08/04/19 0543 08/05/19 5638 08/06/19 0534 08/06/19 1613 08/07/19 0454  BUN 31* 21 16 15 16   CREATININE 0.69 0.74 0.69 0.72 0.75   Hypokalemia 3.0-replaced.Recheck labs are improved.    Leukocytosis: WBC fluctuating and slightly better from 13.6-13.0. She has no fever, no urinary symptoms, advised CBC check in 1 week. Recent Labs  Lab 08/03/19 0439 08/04/19 0543 08/05/19 0607 08/06/19 0534 08/07/19 0454  WBC 8.0 10.9* 11.4* 13.6* 13.0*   Lactic acidosis up to 5.0 likely from volume depletion and bowel obstruction, lactic acidosis has resolved.   Hyperglycemia. likely from stress. Prediabetes on hba1c.  Monitor blood sugar intermittently.  Normocytic anemia: Hemoglobin has been holding stable mid 8 gm. no obvious bleeding Recent Labs  Lab 08/03/19 0439 08/04/19 0543 08/05/19 0607 08/06/19 0534 08/07/19 0454  HGB 8.4* 8.2* 8.9* 8.7* 8.2*  HCT 27.8* 28.3* 29.4* 28.0* 27.3*   Hx of breast ca in remission  RLS hx- resume home meds  Consults:  surgery  Subjective: Resting well no nausea  or vomiting.  Tolerating food.  Had a bowel movement.  Ambulated in the room.  Wants to go home today.  Discharge Exam: Vitals:   08/06/19 2114 08/07/19 0540  BP: (!) 144/93 (!) 149/71  Pulse: 94 100  Resp: 18 18  Temp: 98 F (36.7 C) 98.3 F (36.8 C)  SpO2: 99% 96%   General: Pt is alert, awake,  not in acute distress Cardiovascular: RRR, S1/S2 +, no rubs, no gallops Respiratory: CTA bilaterally, no wheezing, no rhonchi Abdominal: Soft, NT, ND, bowel sounds + Extremities: no edema, no cyanosis  Discharge Instructions  Discharge Instructions    Diet - low sodium heart healthy   Complete by: As directed    Discharge instructions   Complete by: As directed    Please call call MD or return to ER for similar or worsening recurring problem that brought you to hospital or if any fever,nausea/vomiting,abdominal pain, uncontrolled pain, chest pain,  shortness of breath or any other alarming symptoms.  Please follow-up your doctor as instructed in a week time please check CBC and BMP. Call the office for appointment.  Please avoid alcohol, smoking, or any other illicit substance and maintain healthy habits including taking your regular medications as prescribed.  You were cared for by a hospitalist during your hospital stay. If you have any questions about your discharge medications or the care you received while you were in the hospital after you are discharged, you can call the unit and ask to speak with the hospitalist on call if the hospitalist that took care of you is not available.  Once you are discharged, your primary care physician will handle any further medical issues. Please note that NO REFILLS for any discharge medications will be authorized once you are discharged, as it is imperative that you return to your primary care physician (or establish a relationship with a primary care physician if you do not have one) for your aftercare needs so that they can reassess your need for medications and monitor your lab values   Increase activity slowly   Complete by: As directed      Allergies as of 08/07/2019   No Known Allergies     Medication List    STOP taking these medications   amoxicillin-clavulanate 500-125 MG tablet Commonly known as: Augmentin     TAKE these  medications   citalopram 10 MG tablet Commonly known as: CELEXA Take 10 mg by mouth daily.   gabapentin 300 MG capsule Commonly known as: NEURONTIN Take 300 mg by mouth daily.   lisinopril-hydrochlorothiazide 10-12.5 MG tablet Commonly known as: ZESTORETIC Take 1 tablet by mouth daily.   pramipexole 0.125 MG tablet Commonly known as: MIRAPEX Take 0.125 mg by mouth at bedtime.   traMADol 50 MG tablet Commonly known as: ULTRAM Take 50 mg by mouth every 6 (six) hours as needed for moderate pain.   traZODone 50 MG tablet Commonly known as: DESYREL Take 50 mg by mouth at bedtime.       Follow-up Information    Delrae Alfred, DO Follow up in 1 week(s).   Specialty: Family Medicine Contact information: [place] [address] [phone]              No Known Allergies  The results of significant diagnostics from this hospitalization (including imaging, microbiology, ancillary and laboratory) are listed below for reference.    Microbiology: Recent Results (from the past 240 hour(s))  SARS Coronavirus 2 by RT PCR (  hospital order, performed in [facility] hospital lab) Nasopharyngeal Nasopharyngeal Swab     Status: None   Collection Time: 08/01/19  2:31 PM   Specimen: Nasopharyngeal Swab  Result Value Ref Range Status   SARS Coronavirus 2 NEGATIVE NEGATIVE Final    Comment: (NOTE) SARS-CoV-2 target nucleic acids are NOT DETECTED.  The SARS-CoV-2 RNA is generally detectable in upper and lower respiratory specimens during the acute phase of infection. The lowest concentration of SARS-CoV-2 viral copies this assay can detect is 250 copies / mL. A negative result does not preclude SARS-CoV-2 infection and should not be used as the sole basis for treatment or other patient management decisions.  A negative result may occur with improper specimen collection / handling, submission of specimen other than nasopharyngeal swab, presence of viral  mutation(s) within the areas targeted by this assay, and inadequate number of viral copies (<250 copies / mL). A negative result must be combined with clinical observations, patient history, and epidemiological information.  Fact Sheet for Patients:   StrictlyIdeas.no  Fact Sheet for Healthcare Providers: BankingDealers.co.za  This test is not yet approved or  cleared by the [place] FDA and has been authorized for detection and/or diagnosis of SARS-CoV-2 by FDA under an Emergency Use Authorization (EUA).  This EUA will remain in effect (meaning this test can be used) for the duration of the COVID-19 declaration under Section 564(b)(1) of the Act, 21 U.S.C. section 360bbb-3(b)(1), unless the authorization is terminated or revoked sooner.  Performed at [facility], [address] [place], [place] [address]     Procedures/Studies: CT ABDOMEN PELVIS WO CONTRAST  Result Date: 08/01/2019 CLINICAL DATA:  Severe pain EXAM: CT ABDOMEN AND PELVIS WITHOUT CONTRAST TECHNIQUE: Multidetector CT imaging of the abdomen and pelvis was performed following the standard protocol without IV contrast. COMPARISON:  September 02, 2013. FINDINGS: Lower chest: The visualized heart size within normal limits. No pericardial fluid/thickening. There is a moderate to large paraesophageal hernia present. A right effusion is present. Hepatobiliary: The liver is normal in density without focal abnormality.The main portal vein is patent. The patient is status post cholecystectomy. No biliary ductal dilation. Pancreas: Unremarkable. No pancreatic ductal dilatation or surrounding inflammatory changes. Spleen: Normal in size without focal abnormality. Adrenals/Urinary Tract: Both adrenal glands appear normal. The kidneys and collecting system appear normal without evidence of urinary tract calculus or hydronephrosis. Bladder is unremarkable. Stomach/Bowel: A moderate  to large paraesophageal hernia is noted. There is moderately dilated air loops measuring to 3.3 cm to the distal ileum. The terminal ileum appears to be decompressed there appears to be question thickening with surrounding fat stranding changes seen around the cecum. There is air and stool seen throughout the remainder of the colon to the rectum. No pericolonic free free air is. Vascular/Lymphatic: There are no enlarged mesenteric, retroperitoneal, or pelvic lymph nodes. Scattered aortic atherosclerotic calcifications are seen without aneurysmal dilatation. Reproductive: The patient is status post hysterectomy. No adnexal masses or collections seen. Other: No evidence of abdominal wall mass or hernia. Musculoskeletal: No acute or significant osseous findings. There is a levoconvex scoliotic curvature of the lumbar. Chronic superior compression deformity of the L1 vertebral bodies seen than 5% height. IMPRESSION: 1. Findings consistent with a partial small obstruction to the level of the terminal ileum which appears to be decompressed. There is wall thickening with stranding changes the cecum. Non-specific could be due to colitis however cannot underlying obstructing lesion. Upon resolution of symptoms would recommend direct  visualization. 2. Moderate to large paraesophageal hernia 3.  Aortic Atherosclerosis (ICD10-I70.0). 4. Small right effusion Electronically Signed   By: Prudencio Pair M.D.   On: 08/01/2019 16:50   DG Abd 1 View  Result Date: 08/05/2019 CLINICAL DATA:  Small bowel obstruction. EXAM: ABDOMEN - 1 VIEW COMPARISON:  August 03, 2019. FINDINGS: Mildly dilated small bowel loops are noted concerning for small bowel obstruction. No colonic dilatation is noted. Residual contrast is noted in the distal sigmoid colon and rectum. IMPRESSION: Mildly dilated small bowel loops are noted concerning for small bowel obstruction. Electronically Signed   By: Marijo Conception M.D.   On: 08/05/2019 12:54   DG Abd 1  View  Result Date: 08/02/2019 CLINICAL DATA:  Small-bowel obstruction follow-up. EXAM: ABDOMEN - 1 VIEW COMPARISON:  CT scan 08/01/2019 FINDINGS: Persistent dilated air-filled loops of small bowel consistent with small-bowel obstruction. Some residual stool in the rectosigmoid area. No free air is identified. IMPRESSION: Persistent small-bowel obstruction bowel gas pattern. Electronically Signed   By: Marijo Sanes M.D.   On: 08/02/2019 07:03   DG Abd 2 Views  Result Date: 08/06/2019 CLINICAL DATA:  Small-bowel obstruction, abdominal pain EXAM: ABDOMEN - 2 VIEW COMPARISON:  08/05/2019 FINDINGS: Air distended loops of small bowel within the mid abdomen measuring approximately 3 cm in diameter, slightly decreased from prior study. There is air present within the large bowel. No significant residual enteric contrast is seen within the abdomen. No gross free intraperitoneal air on semi upright view. Thoracolumbar levo scoliotic curvature. IMPRESSION: 1. Improving degree of small-bowel dilatation suggestive of resolving small bowel obstruction. Continued radiographic surveillance is suggested. 2. No significant residual enteric contrast within the abdomen. Electronically Signed   By: Davina Poke D.O.   On: 08/06/2019 09:21   DG Abd Portable 1V  Result Date: 08/03/2019 CLINICAL DATA:  Follow-up bowel obstruction. EXAM: PORTABLE ABDOMEN - 1 VIEW COMPARISON:  08/02/2019 FINDINGS: There is a nasogastric tube within the upper abdomen in the expected location of the distal stomach. Contrast opacification of the dilated small bowel loops again noted. Compared with the previous exam there is been mild interval improvement in the degree of small-bowel dilatation. Enteric contrast material is now noted within the colon at the level of the hepatic flexure. IMPRESSION: Mild interval improvement in small bowel obstruction pattern. Enteric contrast material is now noted within the colon at the level of the hepatic  flexure. Electronically Signed   By: Kerby Moors M.D.   On: 08/03/2019 10:27   DG Abd Portable 1V-Small Bowel Obstruction Protocol-initial, 8 hr delay  Result Date: 08/02/2019 CLINICAL DATA:  Small-bowel obstruction EXAM: PORTABLE ABDOMEN - 1 VIEW COMPARISON:  11:30 a.m. FINDINGS: Nasogastric tube overlies the mid body of the stomach. Numerous dilated gas and contrast filled loops of small bowel are seen throughout the abdomen in keeping with a distal small-bowel obstruction. The degree of small-bowel dilation appears unchanged from prior examination. The large bowel appears decompressed. No gross free intraperitoneal gas. No other changes identified. IMPRESSION: Stable findings of distal small bowel obstruction. Contrast from prior CT examination has now progressed into the distal small bowel, but has not yet passed into the colon. Electronically Signed   By: Fidela Salisbury MD   On: 08/02/2019 22:49   DG Abd Portable 1V-Small Bowel Protocol-Position Verification  Result Date: 08/02/2019 CLINICAL DATA:  Nasogastric placement EXAM: PORTABLE ABDOMEN - 1 VIEW COMPARISON:  Earlier same day FINDINGS: Nasogastric 2 passes through the hiatal hernia, under the diaphragm  into the expected location of the gastric antrum or pylorus. Small-bowel obstruction pattern as seen previously. IMPRESSION: Nasogastric tube enters the abdomen, with the tip in the expected location of the antrum or pylorus. Electronically Signed   By: Nelson Chimes M.D.   On: 08/02/2019 13:33    Labs: BNP (last 3 results) No results for input(s): BNP in the last 8760 hours. Basic Metabolic Panel: Recent Labs  Lab 08/04/19 0543 08/05/19 0607 08/06/19 0534 08/06/19 1613 08/07/19 0454  NA 141 140 137 138 138  K 4.1 3.1* 3.0* 3.6 3.5  CL 107 103 101 102 104  CO2 24 22 24 26 26   GLUCOSE 92 90 119* 109* 116*  BUN 31* 21 16 15 16   CREATININE 0.69 0.74 0.69 0.72 0.75  CALCIUM 8.3* 8.7* 8.7* 8.6* 8.9   Liver Function Tests: Recent  Labs  Lab 08/01/19 1431 08/02/19 0027  AST 20 12*  ALT 10 10  ALKPHOS 69 63  BILITOT 0.5 0.7  PROT 7.7 7.2  ALBUMIN 3.6 3.4*   Recent Labs  Lab 08/01/19 1431  LIPASE 17   No results for input(s): AMMONIA in the last 168 hours. CBC: Recent Labs  Lab 08/03/19 0439 08/04/19 0543 08/05/19 0607 08/06/19 0534 08/07/19 0454  WBC 8.0 10.9* 11.4* 13.6* 13.0*  HGB 8.4* 8.2* 8.9* 8.7* 8.2*  HCT 27.8* 28.3* 29.4* 28.0* 27.3*  MCV 84.0 87.1 84.7 81.9 83.7  PLT 393 354 370 382 331   Cardiac Enzymes: No results for input(s): CKTOTAL, CKMB, CKMBINDEX, TROPONINI in the last 168 hours. BNP: Invalid input(s): POCBNP CBG: Recent Labs  Lab 08/05/19 0750 08/05/19 1615 08/06/19 0741 08/06/19 1548 08/07/19 0735  GLUCAP 85 189* 120* 109* 113*   D-Dimer No results for input(s): DDIMER in the last 72 hours. Hgb A1c No results for input(s): HGBA1C in the last 72 hours. Lipid Profile No results for input(s): CHOL, HDL, LDLCALC, TRIG, CHOLHDL, LDLDIRECT in the last 72 hours. Thyroid function studies No results for input(s): TSH, T4TOTAL, T3FREE, THYROIDAB in the last 72 hours.  Invalid input(s): FREET3 Anemia work up No results for input(s): VITAMINB12, FOLATE, FERRITIN, TIBC, IRON, RETICCTPCT in the last 72 hours. Urinalysis    Component Value Date/Time   COLORURINE Yellow 09/02/2013 0055   APPEARANCEUR Hazy 09/02/2013 0055   LABSPEC 1.010 09/02/2013 0055   PHURINE 6.0 09/02/2013 0055   GLUCOSEU 50 mg/dL 09/02/2013 0055   HGBUR 1+ 09/02/2013 0055   BILIRUBINUR Negative 09/02/2013 0055   KETONESUR Negative 09/02/2013 0055   PROTEINUR 100 mg/dL 09/02/2013 0055   NITRITE Negative 09/02/2013 0055   LEUKOCYTESUR Trace 09/02/2013 0055   Sepsis Labs Invalid input(s): PROCALCITONIN,  WBC,  LACTICIDVEN Microbiology Recent Results (from the past 240 hour(s))  SARS Coronavirus 2 by RT PCR (hospital order, performed in [facility] hospital lab) Nasopharyngeal Nasopharyngeal Swab      Status: None   Collection Time: 08/01/19  2:31 PM   Specimen: Nasopharyngeal Swab  Result Value Ref Range Status   SARS Coronavirus 2 NEGATIVE NEGATIVE Final    Comment: (NOTE) SARS-CoV-2 target nucleic acids are NOT DETECTED.  The SARS-CoV-2 RNA is generally detectable in upper and lower respiratory specimens during the acute phase of infection. The lowest concentration of SARS-CoV-2 viral copies this assay can detect is 250 copies / mL. A negative result does not preclude SARS-CoV-2 infection and should not be used as the sole basis for treatment or other patient management decisions.  A negative result may occur with improper specimen collection /  handling, submission of specimen other than nasopharyngeal swab, presence of viral mutation(s) within the areas targeted by this assay, and inadequate number of viral copies (<250 copies / mL). A negative result must be combined with clinical observations, patient history, and epidemiological information.  Fact Sheet for Patients:   StrictlyIdeas.no  Fact Sheet for Healthcare Providers: BankingDealers.co.za  This test is not yet approved or  cleared by the [place] FDA and has been authorized for detection and/or diagnosis of SARS-CoV-2 by FDA under an Emergency Use Authorization (EUA).  This EUA will remain in effect (meaning this test can be used) for the duration of the COVID-19 declaration under Section 564(b)(1) of the Act, 21 U.S.C. section 360bbb-3(b)(1), unless the authorization is terminated or revoked sooner.  Performed at [facility], [address] [place], [place] [address]      Time coordinating discharge: 25  minutes  SIGNED: Antonieta Pert, MD  [facility] 08/07/2019, 9:46 AM  If 7PM-7AM, please contact night-coverage [email or web address]

## 2019-08-07 NOTE — TOC Initial Note (Signed)
Transition of Care [facility]) - Initial/Assessment Note    Patient Details  Name: Amber Hunter MRN: [id number] Date of Birth: 05/17/1928  [facility] of [facility]) CM/SW Contact:    Lia Hopping, [place] Phone Number: 08/07/2019, 11:10 AM  Clinical Narrative:                 CSW met with the patient at beside to discuss discharge plan home with HHPT. Patient reports she lives in the home with her adult children. She is independent with her ADL's and uses a cane to ambulate. CSW offered home health choice. Patient agreeable with [facility] providing physical therapy.  CSW reached out to the patient son Elta [place], left voicemail.   Expected Discharge Plan: [place] Barriers to Discharge: Barriers Resolved   Patient Goals and CMS Choice Patient states their goals for this hospitalization and ongoing recovery are:: to return home CMS Medicare.gov Compare Post Acute Care list provided to:: Patient Choice offered to / list presented to : Patient  Expected Discharge Plan and Services Expected Discharge Plan: [place] In-house Referral: Clinical Social Work Discharge Planning Services: CM Consult Post Acute Care Choice: [place] arrangements for the past 2 months: Single Family Home Expected Discharge Date: 08/07/19               DME Arranged: N/A DME Agency: NA       HH Arranged: PT [place] Agency: [facility] ([facility]) Date [place]: 08/07/19 Time [place]: 1109 Representative spoke with at [place]: Santiago Glad  Prior Living Arrangements/Services Living arrangements for the past 2 months: [place] Lives with:: Adult Children Patient language and need for interpreter reviewed:: No Do you feel safe going back to the place where you live?: Yes      Need for Family Participation in Patient Care: Yes (Comment) Care giver support system in place?: Yes (comment) Current home services:  DME Criminal Activity/Legal Involvement Pertinent to Current Situation/Hospitalization: No - Comment as needed  Activities of Daily Living Home Assistive Devices/Equipment: Walker (specify type) ADL Screening (condition at time of admission) Patient's cognitive ability adequate to safely complete daily activities?: Yes Is the patient deaf or have difficulty hearing?: No Does the patient have difficulty seeing, even when wearing glasses/contacts?: No Does the patient have difficulty concentrating, remembering, or making decisions?: No Patient able to express need for assistance with ADLs?: Yes Does the patient have difficulty dressing or bathing?: Yes Independently performs ADLs?: No Communication: Independent Dressing (OT): Needs assistance Is this a change from baseline?: Pre-admission baseline Grooming: Independent Feeding: Independent Bathing: Needs assistance Is this a change from baseline?: Pre-admission baseline Toileting: Needs assistance Is this a change from baseline?: Pre-admission baseline In/Out Bed: Needs assistance Is this a change from baseline?: Pre-admission baseline Walks in Home: Independent with device (comment) Does the patient have difficulty walking or climbing stairs?: Yes Weakness of Legs: Both Weakness of Arms/Hands: None  Permission Sought/Granted Permission sought to share information with : Family Supports Permission granted to share information with : Yes, Verbal Permission Granted  Share Information with NAME: [facility]  Permission granted to share info w AGENCY: [place] granted to share info w Relationship: Son  Permission granted to share info w Contact Information: [phone]  Emotional Assessment Appearance:: Appears stated age Attitude/Demeanor/Rapport: Engaged Affect (typically observed): Accepting Orientation: : Oriented to Self, Oriented to Place, Oriented to  Time, Oriented to Situation Alcohol / Substance  Use: Not  Applicable Psych Involvement: No (comment)  Admission diagnosis:  Abdominal distension [R14.0] Generalized abdominal pain [R10.84] Partial small bowel obstruction (HCC) [K56.600] Non-intractable vomiting with nausea, unspecified vomiting type [R11.2] SBO (small bowel obstruction) ([place]) [K56.609] Patient Active Problem List   Diagnosis Date Noted  . Partial small bowel obstruction ([place]) 08/01/2019  . Essential hypertension 08/01/2019  . SBO (small bowel obstruction) ([place]) 08/01/2019   PCP:  Delrae Alfred, DO Pharmacy:   [facility] [place], [address] AT [address] [place] [address] Phone: [phone] Fax: [phone]     Social Determinants of Health (SDOH) Interventions    Readmission Risk Interventions No flowsheet data found.

## 2019-08-07 NOTE — Progress Notes (Addendum)
Spoke to patients son, Janai Maudlin for update.

## 2019-08-07 NOTE — Progress Notes (Addendum)
Subjective: Doing well.  Passing flatus and reports 2, non-bloody bowel movements. Tolerating soft diet.  ROS: See above, otherwise other systems negative  Objective: Vital signs in last 24 hours: Temp:  [97.7 F (36.5 C)-98.3 F (36.8 C)] 98.3 F (36.8 C) (07/20 0540) Pulse Rate:  [94-110] 100 (07/20 0540) Resp:  [14-18] 18 (07/20 0540) BP: (143-149)/(71-99) 149/71 (07/20 0540) SpO2:  [96 %-99 %] 96 % (07/20 0540) Last BM Date: 08/06/19  Intake/Output from previous day: 07/19 0701 - 07/20 0700 In: 1979 [P.O.:700; I.V.:1275.5; IV Piggyback:3.5] Out: 0  Intake/Output this shift: No intake/output data recorded.  PE: Abd: soft, NT, Nd, +BS  Lab Results:  Recent Labs    08/06/19 0534 08/07/19 0454  WBC 13.6* 13.0*  HGB 8.7* 8.2*  HCT 28.0* 27.3*  PLT 382 331   BMET Recent Labs    08/06/19 1613 08/07/19 0454  NA 138 138  K 3.6 3.5  CL 102 104  CO2 26 26  GLUCOSE 109* 116*  BUN 15 16  CREATININE 0.72 0.75  CALCIUM 8.6* 8.9   PT/INR No results for input(s): LABPROT, INR in the last 72 hours. CMP     Component Value Date/Time   NA 138 08/07/2019 0454   NA 143 09/03/2013 0433   K 3.5 08/07/2019 0454   K 3.9 09/03/2013 0433   CL 104 08/07/2019 0454   CL 112 (H) 09/03/2013 0433   CO2 26 08/07/2019 0454   CO2 22 09/03/2013 0433   GLUCOSE 116 (H) 08/07/2019 0454   GLUCOSE 124 (H) 09/03/2013 0433   BUN 16 08/07/2019 0454   BUN 11 09/03/2013 0433   CREATININE 0.75 08/07/2019 0454   CREATININE 1.04 09/03/2013 0433   CALCIUM 8.9 08/07/2019 0454   CALCIUM 8.1 (L) 09/03/2013 0433   PROT 7.2 08/02/2019 0027   PROT 8.1 09/01/2013 2320   ALBUMIN 3.4 (L) 08/02/2019 0027   ALBUMIN 3.9 09/01/2013 2320   AST 12 (L) 08/02/2019 0027   AST 39 (H) 09/01/2013 2320   ALT 10 08/02/2019 0027   ALT 28 09/01/2013 2320   ALKPHOS 63 08/02/2019 0027   ALKPHOS 72 09/01/2013 2320   BILITOT 0.7 08/02/2019 0027   BILITOT 0.4 09/01/2013 2320   GFRNONAA >60 08/07/2019  0454   GFRNONAA 50 (L) 09/03/2013 0433   GFRAA >60 08/07/2019 0454   GFRAA 58 (L) 09/03/2013 0433   Lipase     Component Value Date/Time   LIPASE 17 08/01/2019 1431   LIPASE 146 09/01/2013 2320       Studies/Results: DG Abd 2 Views  Result Date: 08/06/2019 CLINICAL DATA:  Small-bowel obstruction, abdominal pain EXAM: ABDOMEN - 2 VIEW COMPARISON:  08/05/2019 FINDINGS: Air distended loops of small bowel within the mid abdomen measuring approximately 3 cm in diameter, slightly decreased from prior study. There is air present within the large bowel. No significant residual enteric contrast is seen within the abdomen. No gross free intraperitoneal air on semi upright view. Thoracolumbar levo scoliotic curvature. IMPRESSION: 1. Improving degree of small-bowel dilatation suggestive of resolving small bowel obstruction. Continued radiographic surveillance is suggested. 2. No significant residual enteric contrast within the abdomen. Electronically Signed   By: Davina Poke D.O.   On: 08/06/2019 09:21    Anti-infectives: Anti-infectives (From admission, onward)   None       Assessment/Plan CKD, stage III H/O breast cancer HTN RLS Hiatal hernia  SBO -resolved -tolerating soft diet  -mobilize, pulm toilet/IS -no surgical follow up  needed, stable for discharge from surgical perspective   FEN - soft diet VTE - heparin ID - none   LOS: 6 days   Jill Alexanders , [facility] Surgery 08/07/2019, 10:09 AM Please see Amion for pager number during day hours 7:00am-4:30pm or 7:00am -11:30am on weekends  Agree with above.  She is doing well.   Okay to go home.  Alphonsa Overall, MD, [facility] Surgery Office phone:  [phone]

## 2019-08-07 NOTE — Progress Notes (Signed)
PROGRESS NOTE    Amber Hunter  ZSW:[id number] DOB: 1928/09/11 DOA: 08/01/2019 PCP: Delrae Alfred, DO   Chef Complaints: Nausea vomiting abdominal pain x3 days  Brief Narrative: 86 y.o.femalewithhistory of hypertension, chronic kidney disease stage III, breast cancer in remission, restless leg syndrome, chronic pain on tramadol presents to the ER at [facility] with complaints of nausea vomiting abdominal pain for last 3 days. Has not moved her bowels for the same time. Pain is generalized and constant. Denies fever chills chest pain or shortness of breath. Denies any blood in the vomitus.  ED Course:In the ER CT abdomen pelvis shows features concerning for partial small bowel obstruction. On-call general surgeon was consulted and patient admitted for further management of partial small bowel obstruction. Labs are significant for elevated lactic acid which improved with fluids. Also seen is hyperglycemia elevated creatinine hemoglobin of 10.4 mild leukocytosis Covid test was negative. EKG was showing sinus tachycardia with LBBB  Patient was seen by surgery, managed conservatively with NG tube decompression, IV fluids n.p.o. subsequently diet was slowly advanced after clamping the NG tube and it was removed.  At this time off NG tube, tolerating diet, advanced to soft diet 08/06/19 She is tolerating diet.  She was anxious to go home. She reports passing flatus and moving bowels.  Subjective: Patient was being planned for discharge however on routine vitals found to have tachycardia in130s but patient denied any symptoms, later was c/o abdomen pain.  She still wants to go home, does not want to stay in the hospital, son at the bedside requesting her to stay.  I spent some time trying to convince her to stay in the hospital for further work-up.  Patient denies chest pain, nausea, vomiting. Later she started complaining of mild abdominal pain in upper abdomen.  Assessment &  Plan: Small bowel obstruction likely from adhesive disease due to past abdominal surgeries hysterectomy/appendicectomy:SBO has resolved, patient has been tolerating diet. 7/19- X-ray showing improving degree of small bowel dilation.  Sinus tachycardia.On routine vital check patient was having sinus tachycardia in 130s sudden onset this afternoon.Patient was sweating on back. Ordered routine labs,chest x-ray D-dimer troponin, chest x-ray.  Chest x-ray no acute finding.  labs shows leukocytosis up 17.3, troponin at 18. ?etiology- has abdomen pain- will order ct abdomen stat. D-dimer is mildly elevated at 1.02 (age cut off 0.89), no chest pain or hypoxia, spo2 at 100%, less suspicion of VTE.We will give 1 litre IV bolus. UA came back abnormal suspecting UTI- discussed with her son he feels like she has UTI based on her depends use-he agrees with current plan.If persistent tachycardia then can explore into VQ scan. We will transfer to tele.  Essential hypertension on lisinopril HCTZ at home:  BP stable-meds on hold.  AKI on CKD IIIa:2/2 Bowel obstruction.Patient also having poor intake it seems.  She was on IV fluids and was discontinued as she was tolerating diet. Repeat lab this afternoon compared from this morning BUN went up to 27 from 16, creatinine 0.75>1.0.  We will keep on IV fluids, trying to obtain iv access  Hypokalemia resolved.   Leukocytosis: WBC fluctuating at 13.6> 13. But this afternon went up 17k. Unclear etiology, no fever, chest x-ray repeated this afternoon no acute finding.  Check UA.  We will hydrate her as planned above for dehydration which could be the cause vs stress response.  UA came back grossly abnormal turbid appearance, nitrite positive, leukocyte moderate, WBC more than 50 bacteria many-we  will add empiric ceftriaxone, send urine culture.  Lactic acidosis up to 5.0 likely from volume depletion and bowel obstruction, lactic acidosis has resolved.   Hyperglycemia. likely  from stress. Prediabetes on hba1c.  Monitor blood sugar intermittently.  Normocytic anemia: Hemoglobin stable  Recent Labs  Lab 08/04/19 0543 08/05/19 0607 08/06/19 0534 08/07/19 0454 08/07/19 1519  HGB 8.2* 8.9* 8.7* 8.2* 9.1*  HCT 28.3* 29.4* 28.0* 27.3* 30.0*   Hx of breast ca in remission.  RLS hx-home meds on hold.  DVT prophylaxis: heparin injection 5,000 Units Start: 08/02/19 1100 SCDs Start: 08/01/19 2349.   Code Status:Full code Family Communication:Plan of care discussed with patient and son at bedside extensively.   Status is: Inpatient Remains inpatient appropriate because: Discharge held due to patient's sudden onset of sinus tachycardia and tachypnea. Dispo:The patient is from: Home            Anticipated d/c is to: Home.PT recommending home health PT.             Anticipated d/c date is: 1-2 daya            Patient currently is not medically stable to d/c.   Diet Order            Diet - low sodium heart healthy           DIET SOFT Room service appropriate? Yes; Fluid consistency: Thin  Diet effective now               Body mass index is 25.15 kg/m.  Consultants:see note  Procedures:see note Microbiology:see note  Medications: Scheduled Meds:  heparin injection (subcutaneous)  5,000 Units Subcutaneous Q8H   pneumococcal 23 valent vaccine  0.5 mL Intramuscular Tomorrow-1000   sodium chloride flush  3 mL Intravenous Once   Continuous Infusions:  sodium chloride      Antimicrobials: Anti-infectives (From admission, onward)   None       Objective: Vitals: Today's Vitals   08/07/19 1354 08/07/19 1407 08/07/19 1413 08/07/19 1436  BP: (!) 149/91 (!) 149/96 (!) 149/65   Pulse: (!) 125 (!) 128 (!) 132 (!) 133  Resp: (!) 21 (!) 24 18   Temp: 98.3 F (36.8 C) 98.2 F (36.8 C) 98.5 F (36.9 C)   TempSrc: Oral  Oral   SpO2: 100% 100% 100% 100%  Weight:      Height:      PainSc:    0-No pain    Intake/Output Summary (Last 24 hours)  at 08/07/2019 1620 Last data filed at 08/07/2019 1300 Gross per 24 hour  Intake 1184.52 ml  Output --  Net 1184.52 ml   Filed Weights   08/01/19 1405  Weight: 64.4 kg   Weight change:    Intake/Output from previous day: 07/19 0701 - 07/20 0700 In: 1979 [P.O.:700; I.V.:1275.5; IV Piggyback:3.5] Out: 0  Intake/Output this shift: Total I/O In: 50 [P.O.:50] Out: -   Examination: General exam: AAOx3, anxious in mild distress,upset about not being able to go home HEENT:Oral mucosa DRY, Ear/Nose WNL grossly, dentition normal. Respiratory system: bilaterally clear,no wheezing or crackles, tachypneic in 24 Cardiovascular system: S1 & S2 +, No JVD,. Gastrointestinal system: Abdomen soft, full abdomen, mildly tender,BS+ Nervous System:Alert, awake, moving extremities and grossly nonfocal Extremities: No edema, distal peripheral pulses palpable.  Skin: No rashes,no icterus. MSK: Normal muscle bulk,tone, power  Data Reviewed: I have personally reviewed following labs and imaging studies CBC: Recent Labs  Lab 08/04/19 0543 08/05/19 0607 08/06/19 0534 08/07/19  0454 08/07/19 1519  WBC 10.9* 11.4* 13.6* 13.0* 17.3*  HGB 8.2* 8.9* 8.7* 8.2* 9.1*  HCT 28.3* 29.4* 28.0* 27.3* 30.0*  MCV 87.1 84.7 81.9 83.7 83.3  PLT 354 370 382 331 542*   Basic Metabolic Panel: Recent Labs  Lab 08/05/19 0607 08/06/19 0534 08/06/19 1613 08/07/19 0454 08/07/19 1519  NA 140 137 138 138 139  K 3.1* 3.0* 3.6 3.5 4.0  CL 103 101 102 104 100  CO2 22 24 26 26 27   GLUCOSE 90 119* 109* 116* 174*  BUN 21 16 15 16  27*  CREATININE 0.74 0.69 0.72 0.75 1.01*  CALCIUM 8.7* 8.7* 8.6* 8.9 9.4   GFR: Estimated Creatinine Clearance: 34.1 mL/min (A) (by C-G formula based on SCr of 1.01 mg/dL (H)). Liver Function Tests: Recent Labs  Lab 08/01/19 1431 08/02/19 0027 08/07/19 1519  AST 20 12* 35  ALT 10 10 24   ALKPHOS 69 63 58  BILITOT 0.5 0.7 0.3  PROT 7.7 7.2 6.8  ALBUMIN 3.6 3.4* 3.3*   Recent  Labs  Lab 08/01/19 1431  LIPASE 17   No results for input(s): AMMONIA in the last 168 hours. Coagulation Profile: No results for input(s): INR, PROTIME in the last 168 hours. Cardiac Enzymes: No results for input(s): CKTOTAL, CKMB, CKMBINDEX, TROPONINI in the last 168 hours. BNP (last 3 results) No results for input(s): PROBNP in the last 8760 hours. HbA1C: No results for input(s): HGBA1C in the last 72 hours. CBG: Recent Labs  Lab 08/05/19 1615 08/06/19 0741 08/06/19 1548 08/07/19 0735 08/07/19 1532  GLUCAP 189* 120* 109* 113* 153*   Lipid Profile: No results for input(s): CHOL, HDL, LDLCALC, TRIG, CHOLHDL, LDLDIRECT in the last 72 hours. Thyroid Function Tests: No results for input(s): TSH, T4TOTAL, FREET4, T3FREE, THYROIDAB in the last 72 hours. Anemia Panel: No results for input(s): VITAMINB12, FOLATE, FERRITIN, TIBC, IRON, RETICCTPCT in the last 72 hours. Sepsis Labs: Recent Labs  Lab 08/01/19 1431 08/01/19 1649 08/02/19 0027 08/02/19 0424  LATICACIDVEN 5.0* 2.4* 1.1 0.9    Recent Results (from the past 240 hour(s))  SARS Coronavirus 2 by RT PCR (hospital order, performed in [facility] hospital lab) Nasopharyngeal Nasopharyngeal Swab     Status: None   Collection Time: 08/01/19  2:31 PM   Specimen: Nasopharyngeal Swab  Result Value Ref Range Status   SARS Coronavirus 2 NEGATIVE NEGATIVE Final    Comment: (NOTE) SARS-CoV-2 target nucleic acids are NOT DETECTED.  The SARS-CoV-2 RNA is generally detectable in upper and lower respiratory specimens during the acute phase of infection. The lowest concentration of SARS-CoV-2 viral copies this assay can detect is 250 copies / mL. A negative result does not preclude SARS-CoV-2 infection and should not be used as the sole basis for treatment or other patient management decisions.  A negative result may occur with improper specimen collection / handling, submission of specimen other than nasopharyngeal swab,  presence of viral mutation(s) within the areas targeted by this assay, and inadequate number of viral copies (<250 copies / mL). A negative result must be combined with clinical observations, patient history, and epidemiological information.  Fact Sheet for Patients:   StrictlyIdeas.no  Fact Sheet for Healthcare Providers: BankingDealers.co.za  This test is not yet approved or  cleared by the [place] FDA and has been authorized for detection and/or diagnosis of SARS-CoV-2 by FDA under an Emergency Use Authorization (EUA).  This EUA will remain in effect (meaning this test can be used) for the duration of the  COVID-19 declaration under Section 564(b)(1) of the Act, 21 U.S.C. section 360bbb-3(b)(1), unless the authorization is terminated or revoked sooner.  Performed at [facility], [address], [place], [place] [address]       Radiology Studies: DG Chest [place] 1 View  Result Date: 08/07/2019 CLINICAL DATA:  Pt has onset tachycardia and Hx of HTN EXAM: PORTABLE CHEST 1 VIEW COMPARISON:  August 01, 2019; January 15, 2018 FINDINGS: The cardiomediastinal silhouette is unchanged in contour.Large hiatal hernia. Unchanged elevation of the right hemidiaphragm. No pleural effusion. No pneumothorax. No acute pleuroparenchymal abnormality. Surgical clips project over the right upper quadrant. Multilevel degenerative changes of the thoracic spine. Likely remote posttraumatic changes of the right shoulder. IMPRESSION: No acute cardiopulmonary abnormality.  Large hiatal hernia. Electronically Signed   By: Valentino Saxon MD   On: 08/07/2019 15:40   DG Abd 2 Views  Result Date: 08/06/2019 CLINICAL DATA:  Small-bowel obstruction, abdominal pain EXAM: ABDOMEN - 2 VIEW COMPARISON:  08/05/2019 FINDINGS: Air distended loops of small bowel within the mid abdomen measuring approximately 3 cm in diameter, slightly decreased from prior study.  There is air present within the large bowel. No significant residual enteric contrast is seen within the abdomen. No gross free intraperitoneal air on semi upright view. Thoracolumbar levo scoliotic curvature. IMPRESSION: 1. Improving degree of small-bowel dilatation suggestive of resolving small bowel obstruction. Continued radiographic surveillance is suggested. 2. No significant residual enteric contrast within the abdomen. Electronically Signed   By: Davina Poke D.O.   On: 08/06/2019 09:21     LOS: 6 days   Antonieta Pert, MD [facility]  08/07/2019, 4:20 PM

## 2019-08-08 ENCOUNTER — Inpatient Hospital Stay (HOSPITAL_COMMUNITY): Payer: Medicare Other

## 2019-08-08 DIAGNOSIS — K56609 Unspecified intestinal obstruction, unspecified as to partial versus complete obstruction: Secondary | ICD-10-CM

## 2019-08-08 LAB — CBC
HCT: 27.6 % — ABNORMAL LOW (ref 36.0–46.0)
Hemoglobin: 8.3 g/dL — ABNORMAL LOW (ref 12.0–15.0)
MCH: 25.3 pg — ABNORMAL LOW (ref 26.0–34.0)
MCHC: 30.1 g/dL (ref 30.0–36.0)
MCV: 84.1 fL (ref 80.0–100.0)
Platelets: 377 10*3/uL (ref 150–400)
RBC: 3.28 MIL/uL — ABNORMAL LOW (ref 3.87–5.11)
RDW: 14.5 % (ref 11.5–15.5)
WBC: 27.4 10*3/uL — ABNORMAL HIGH (ref 4.0–10.5)
nRBC: 0 % (ref 0.0–0.2)

## 2019-08-08 LAB — COMPREHENSIVE METABOLIC PANEL
ALT: 45 U/L — ABNORMAL HIGH (ref 0–44)
AST: 58 U/L — ABNORMAL HIGH (ref 15–41)
Albumin: 3 g/dL — ABNORMAL LOW (ref 3.5–5.0)
Alkaline Phosphatase: 55 U/L (ref 38–126)
Anion gap: 10 (ref 5–15)
BUN: 28 mg/dL — ABNORMAL HIGH (ref 8–23)
CO2: 24 mmol/L (ref 22–32)
Calcium: 8.6 mg/dL — ABNORMAL LOW (ref 8.9–10.3)
Chloride: 105 mmol/L (ref 98–111)
Creatinine, Ser: 0.8 mg/dL (ref 0.44–1.00)
GFR calc Af Amer: >60 mL/min (ref >60)
GFR calc non Af Amer: >60 mL/min (ref >60)
Glucose, Bld: 151 mg/dL — ABNORMAL HIGH (ref 70–99)
Potassium: 3.7 mmol/L (ref 3.5–5.1)
Sodium: 139 mmol/L (ref 135–145)
Total Bilirubin: 0.5 mg/dL (ref 0.3–1.2)
Total Protein: 6 g/dL — ABNORMAL LOW (ref 6.5–8.1)

## 2019-08-08 LAB — GLUCOSE, CAPILLARY: Glucose-Capillary: 141 mg/dL — ABNORMAL HIGH (ref 70–99)

## 2019-08-08 MED ORDER — ACETAMINOPHEN 325 MG PO TABS
650.0000 mg | ORAL_TABLET | Freq: Four times a day (QID) | ORAL | Status: DC | PRN
  Administered 2019-08-08 (×2): 650 mg via ORAL
  Filled 2019-08-08 (×2): qty 2

## 2019-08-08 MED ORDER — FENTANYL CITRATE (PF) 100 MCG/2ML IJ SOLN
12.5000 ug | Freq: Once | INTRAMUSCULAR | Status: AC
  Administered 2019-08-08: 12.5 ug via INTRAVENOUS
  Filled 2019-08-08: qty 2

## 2019-08-08 MED ORDER — ACETAMINOPHEN 650 MG RE SUPP: 650.0000 mg | Freq: Four times a day (QID) | RECTAL | Status: DC | PRN

## 2019-08-08 NOTE — TOC Progression Note (Signed)
Transition of Care [facility]) - Progression Note    Patient Details  Name: Amber Hunter MRN: [id number] Date of Birth: Feb 06, 1928  Transition of [facility]) CM/SW [place], LCSW Phone Number: 08/08/2019, 12:17 PM  Clinical Narrative:    RW ordered and delivered to the patient bedside by [employer].    Expected Discharge Plan: [place] Barriers to Discharge: Barriers Resolved  Expected Discharge Plan and Services Expected Discharge Plan: [place] In-house Referral: Clinical Social Work Discharge Planning Services: CM Consult Post Acute Care Choice: [place] arrangements for the past 2 months: Single Family Home Expected Discharge Date: 08/07/19               DME Arranged: Gilford Rile rolling DME Agency: [employer] Date DME Agency Contacted: 08/07/19 Time DME Agency Contacted: [phone] Representative spoke with at DME Agency: [place]: PT [place]: [place] ([place]) Date [place]: 08/07/19 Time [place]: [id number] Representative spoke with at [place]: [place] ([place]) Interventions    Readmission Risk Interventions No flowsheet data found.

## 2019-08-08 NOTE — Progress Notes (Addendum)
Subjective:  Per chart review and discussion with hospitalist, pt developed tachycardia and worsening leukocytosis overnight. CT demonstrates persistent pSBO with an inflammatory process at the level of the cecum. UA significant for UTI. Cultures are pending.    This morning the patient reports her last episode of flatus was last night. She also had a non-bloody BM last night. Small-volume, brown emesis this morning but denies nausea currently. Endorses dysuria. Also has chronic urinary incontinence.   ROS: See above, otherwise other systems negative  Objective: Vital signs in last 24 hours: Temp:  [98 F (36.7 C)-98.5 F (36.9 C)] 98 F (36.7 C) (07/21 0335) Pulse Rate:  [115-133] 118 (07/21 0335) Resp:  [16-24] 24 (07/21 0335) BP: (137-151)/(65-96) 147/81 (07/21 0335) SpO2:  [96 %-100 %] 96 % (07/21 0335) Last BM Date: 08/06/19  Intake/Output from previous day: 07/20 0701 - 07/21 0700 In: 1365.9 [P.O.:340; IV Piggyback:1025.9] Out: 100 [Urine:100] Intake/Output this shift: No intake/output data recorded.  PE: Gen: alert, cooperative, NAD Pulm: normal effort  Abd: soft, mild to moderate distention, non-tender, +BS, previous lower abdominal scar appears well healed.  Lab Results:  Recent Labs    08/07/19 1519 08/08/19 0433  WBC 17.3* 27.4*  HGB 9.1* 8.3*  HCT 30.0* 27.6*  PLT 437* 377   BMET Recent Labs    08/07/19 1519 08/08/19 0433  NA 139 139  K 4.0 3.7  CL 100 105  CO2 27 24  GLUCOSE 174* 151*  BUN 27* 28*  CREATININE 1.01* 0.80  CALCIUM 9.4 8.6*   PT/INR No results for input(s): LABPROT, INR in the last 72 hours. CMP     Component Value Date/Time   NA 139 08/08/2019 0433   NA 143 09/03/2013 0433   K 3.7 08/08/2019 0433   K 3.9 09/03/2013 0433   CL 105 08/08/2019 0433   CL 112 (H) 09/03/2013 0433   CO2 24 08/08/2019 0433   CO2 22 09/03/2013 0433   GLUCOSE 151 (H) 08/08/2019 0433   GLUCOSE 124 (H) 09/03/2013 0433   BUN 28 (H)  08/08/2019 0433   BUN 11 09/03/2013 0433   CREATININE 0.80 08/08/2019 0433   CREATININE 1.04 09/03/2013 0433   CALCIUM 8.6 (L) 08/08/2019 0433   CALCIUM 8.1 (L) 09/03/2013 0433   PROT 6.0 (L) 08/08/2019 0433   PROT 8.1 09/01/2013 2320   ALBUMIN 3.0 (L) 08/08/2019 0433   ALBUMIN 3.9 09/01/2013 2320   AST 58 (H) 08/08/2019 0433   AST 39 (H) 09/01/2013 2320   ALT 45 (H) 08/08/2019 0433   ALT 28 09/01/2013 2320   ALKPHOS 55 08/08/2019 0433   ALKPHOS 72 09/01/2013 2320   BILITOT 0.5 08/08/2019 0433   BILITOT 0.4 09/01/2013 2320   GFRNONAA >60 08/08/2019 0433   GFRNONAA 50 (L) 09/03/2013 0433   GFRAA >60 08/08/2019 0433   GFRAA 58 (L) 09/03/2013 0433   Lipase     Component Value Date/Time   LIPASE 17 08/01/2019 1431   LIPASE 146 09/01/2013 2320       Studies/Results: CT ABDOMEN PELVIS WO CONTRAST  Result Date: 08/07/2019 CLINICAL DATA:  Abdominal pain, SBO EXAM: CT ABDOMEN AND PELVIS WITHOUT CONTRAST TECHNIQUE: Multidetector CT imaging of the abdomen and pelvis was performed following the standard protocol without IV contrast. COMPARISON:  CT 08/01/2019 FINDINGS: Lower chest: Scarring versus atelectasis in the lingula. Additional atelectatic features in the left lower lobe adjacent the large left hiatal hernia. Normal cardiac size. Extensive three-vessel coronary artery disease  is noted. No pericardial effusion. Hepatobiliary: No visible focal liver lesion. Normal liver attenuation with smooth surface contour. Post cholecystectomy. No biliary ductal dilatation or visible intraductal gallstones accounting for age and reservoir effect. Pancreas: Partial fatty replacement of the pancreas. No pancreatic ductal dilatation or surrounding inflammatory changes. Spleen: Normal in size. No concerning splenic lesions. Adrenals/Urinary Tract: Stable mild lobular thickening of the adrenal glands, possibly some senescent hyperplasia, unchanged from prior without concerning dominant adrenal nodule.  Kidneys are normally located. Stable mild bilateral perinephric stranding, nonspecific. No concerning renal mass. No urolithiasis or hydronephrosis. Urinary bladder is largely decompressed at the time of exam and therefore poorly evaluated by CT imaging. Mild wall thickening likely related to underdistention. Bladder evaluation also partially limited by extensive streak artifact from a right hip prosthesis. Stomach/Bowel: Large fluid-filled hiatal hernia. Air and fluid distention of the distal stomach as well. Borderline dilatation of the duodenum with air and fluid distention of nearly the entirety of the small bowel from jejunum to terminal ileum aside from a single partially decompressed segment in the right hemiabdomen though this loop did appear dilated on prior imaging and there is distension both upstream and downstream from this finding. Some irregular thickening and inflammation is again seen centered upon the ileocecal valve. Colon is largely decompressed. Scattered colonic diverticula without focal inflammation to suggest diverticulitis. Vascular/Lymphatic: Atherosclerotic calcifications within the abdominal aorta and branch vessels. No aneurysm or ectasia. No enlarged abdominopelvic lymph nodes. Reproductive: Uterus is surgically absent. No concerning adnexal lesions. Other: Inflammatory changes in the mesentery of the right lower quadrant and about the ileocecal valve. Trace free fluid in the mesenteric leaflets. No discernible pneumatosis or portal venous gas is evident. No abdominopelvic free air. No bowel containing hernias. Musculoskeletal: Severe levocurvature of the lumbar spine with multilevel discogenic and facet degenerative changes are similar to prior. Prior right hip total arthroplasty with lateral acetabular screw fixation proud of the cortical surface, unchanged appearance from prior without other complication. Additional degenerative changes in the SI joints and left hip. IMPRESSION: 1.  Evidence of persistent small bowel obstruction with most focal transition at the level of the cecum which demonstrates irregular thickening and surrounding inflammatory changes. Colon is largely decompressed. No evidence of perforation, discernible pneumatosis or portal venous gas is evident. Consider direct visualization following resolution of the immediate symptoms. 2. Large fluid-filled hiatal hernia. 3. Colonic diverticulosis without evidence of diverticulitis. 4. Prior right hip total arthroplasty with lateral acetabular screw fixation proud of the cortical surface, unchanged from prior. 5. Aortic Atherosclerosis (ICD10-I70.0). 6. Coronary artery atherosclerosis. Electronically Signed   By: Lovena Le M.D.   On: 08/07/2019 19:10   DG Chest Port 1 View  Result Date: 08/07/2019 CLINICAL DATA:  Pt has onset tachycardia and Hx of HTN EXAM: PORTABLE CHEST 1 VIEW COMPARISON:  August 01, 2019; January 15, 2018 FINDINGS: The cardiomediastinal silhouette is unchanged in contour.Large hiatal hernia. Unchanged elevation of the right hemidiaphragm. No pleural effusion. No pneumothorax. No acute pleuroparenchymal abnormality. Surgical clips project over the right upper quadrant. Multilevel degenerative changes of the thoracic spine. Likely remote posttraumatic changes of the right shoulder. IMPRESSION: No acute cardiopulmonary abnormality.  Large hiatal hernia. Electronically Signed   By: Valentino Saxon MD   On: 08/07/2019 15:40    Anti-infectives: Anti-infectives (From admission, onward)   Start     Dose/Rate Route Frequency Ordered Stop   08/07/19 1800  cefTRIAXone (ROCEPHIN) 1 g in sodium chloride 0.9 % 100 mL IVPB  Discontinue     1 g 200 mL/hr over 30 Minutes Intravenous Every 24 hours 08/07/19 1713         Assessment/Plan CKD, stage III H/O breast cancer HTN RLS Hiatal hernia Chronic pain  Severe scoliosis  UTI - on rocephin Leukocytosis - 27, suspect related to above UTI    pSBO  -admitted 7/14, NG tube was placed, SB protocol showed progression of contrast to the colon and the patient clinically improved and was advanced to solid diet.  - evening of 7/20 the patient developed tachycardia, leukocytosis, repeat CT/abd showed persistent pSBO and and inflammatory process at the level of the cecum.   - patient clinically shows signs of pSBO with some flatus/stool but also nausea/vomiting/distention.   - recommend NG tube for decompression, may warrant diagnostic laparoscopy to determine if this obstruction is due to adhesive disease vs inflammatory process/tumor at the level of the cecum.   - will try to locate past colonoscopy records  - will discuss with Dr. Lucia Gaskins and patients family, given the patients age and debility a goals of care discussion is probably warranted.  FEN - soft diet VTE - heparin ID - none   LOS: 7 days   Jill Alexanders , [facility] Surgery 08/08/2019, 10:05 AM Please see Amion for pager number during day hours 7:00am-4:30pm or 7:00am -11:30am on weekends  Agree with above.  Increased WBC does not seem to be coming from an intra-abdominal source.  Most likely urinary.  Her grandson says that she keeps a UTI. CT scan on 08/07/2019 is concerning for ileocecal mass/inflammation.  Actually, this was there on the CT scan on 08/01/2019.  I think the only way to resolve whether she has a ileo-cecal mass is to operate on her.  I would laparoscope her to figure out what is going on and proceed with what I find.  She may just need an enterolysis vs a bowel resection.  Her age makes any major surgery risky.  I spoke to her son, Bristal Steffy, and grandson, Governor Rooks, on the phone about these options.  He is going to talk to his mother and they are going to make a decision.  Alphonsa Overall, MD, [facility] Surgery Office phone:  [phone]

## 2019-08-08 NOTE — Progress Notes (Signed)
PROGRESS NOTE  Amber Hunter  ERD:[id number] DOB: 05-07-1928 DOA: 08/01/2019 PCP: Delrae Alfred, DO   Brief Narrative: Amber Hunter is an 86 y.o. female with a history of stage IIIa CKD, breast CA in remission, RLS, chronic pain who presented to [facility] 7/14 with nausea, vomiting, abdominal pain found to have imaging evidence of partial small bowel obstruction. Shew was admitted to the hospitalist service and treated conservatively per general surgery recommendations including NG tube decompression, IV fluids, and bowel rest. After passing clamping trial and seeing resolution of leukocytosis, NG tube was pulled and diet slowly advanced. On 7/20, the patient developed significant sinus tachycardia and abdominal pain with rising WBC. Ceftriaxone was started for nitrite-positive pyuria and bacteriuria. CT abd/pelvis was repeated showing persistent small bowel obstruction with transition point at a point of irregular thickening at the cecum. General surgery was reconsulted.  Assessment & Plan: Principal Problem:   Partial small bowel obstruction (HCC) Active Problems:   Essential hypertension   SBO (small bowel obstruction) (HCC)  SBO: With transition point near cecum which is irregularly thickened (?mass vs. inflammation). Previously suspected to be due to adhesions s/p appendectomy and hysterectomy.  - NG tube will need to be reinserted.  - IVF - NPO - Surgery seeing this AM, Spoke with Melina Modena, PA - Attempted to elicit goals of care with the patient today who did state if this were not to improve, she would want surgical options presented.   Sepsis due to UTI: Strongly suggested by UA results. Leukocytosis and sinus tachycardia suggest SIRS. Also note bilateral perinephric stranding and bladder wall thickening on CT. - Continue ceftriaxone pending urine culture data.  - Monitor leukocytosis (coming from UTI vs. recurrence of bowel obstruction)  HTN:  - Holding lisinopril, HCTZ for now, not  severely elevated.  AKI on stage IIIa CKD: Improved with fluids which will need to be restarted. Note prerenal FENa. No hydronephrosis on CT. - Monitor, continue IVF at 125cc/hr.   Hyperglycemia due to prediabetes: Technically meets definition with HbA1c 5.8%.  - Optimize glycemic control.   Breast CA: In remission  RLS:  - Holding mirapex while NPO.  Lactic acidosis: POA, resolved.   Hypokalemia: Resolved.   Depression:  - Hold citalopram while NPO.   LFT elevation: Likely due to sepsis. Will recheck intermittently. Liver appeared normal on CT  DVT prophylaxis: Heparin Code Status: Full confirmed with patient Family Communication: None at bedside. Surgery speaking about options with family. Disposition Plan:  Status is: Inpatient  Remains inpatient appropriate because:Hemodynamically unstable, Ongoing active pain requiring inpatient pain management, IV treatments appropriate due to intensity of illness or inability to take PO and Inpatient level of care appropriate due to severity of illness   Dispo: The patient is from: Home              Anticipated d/c is to: Home              Anticipated d/c date is: 3 days              Patient currently is not medically stable to d/c.  Consultants:   General surgery  Procedures:   None  Antimicrobials:  Ceftriaxone 7/20 >>    Subjective: Abdominal pain is constant, diffuse but worse centrally, moderate, worse with palpation. Passing flatus but can't remember LBM. Wants ice chips.   Objective: Vitals:   08/07/19 2218 08/08/19 0228 08/08/19 0335 08/08/19 1158  BP: (!) 147/73 (!) 142/83 (!) 147/81 131/79  Pulse: (!) 115 Marland Kitchen)  129 (!) 118 (!) 123  Resp: 18 16 (!) 24 (!) 30  Temp: 98.1 F (36.7 C) 98 F (36.7 C) 98 F (36.7 C) 98.5 F (36.9 C)  TempSrc: Oral Oral Oral Oral  SpO2: 98% 96% 96% 93%  Weight:      Height:        Intake/Output Summary (Last 24 hours) at 08/08/2019 1456 Last data filed at 08/08/2019  1300 Gross per 24 hour  Intake 1125.93 ml  Output 1000 ml  Net 125.93 ml   Filed Weights   08/01/19 1405  Weight: 64.4 kg    Gen: Elderly alert female in no distress. Pulm: Non-labored breathing. Clear to auscultation bilaterally.  CV: Regular rate and rhythm. No murmur, rub, or gallop. No JVD, no pedal edema. GI: Abdomen soft, tender, distended without rebound, very hypoactive bowel sounds.   Ext: Warm, no deformities Skin: No rashes, lesions or ulcers on visualized skin. Neuro: Alert and oriented. No focal neurological deficits. Psych: Judgement and insight appear normal. Mood & affect appropriate.   Data Reviewed: I have personally reviewed following labs and imaging studies  CBC: Recent Labs  Lab 08/05/19 0607 08/06/19 0534 08/07/19 0454 08/07/19 1519 08/08/19 0433  WBC 11.4* 13.6* 13.0* 17.3* 27.4*  HGB 8.9* 8.7* 8.2* 9.1* 8.3*  HCT 29.4* 28.0* 27.3* 30.0* 27.6*  MCV 84.7 81.9 83.7 83.3 84.1  PLT 370 382 331 437* 287   Basic Metabolic Panel: Recent Labs  Lab 08/06/19 0534 08/06/19 1613 08/07/19 0454 08/07/19 1519 08/08/19 0433  NA 137 138 138 139 139  K 3.0* 3.6 3.5 4.0 3.7  CL 101 102 104 100 105  CO2 24 26 26 27 24   GLUCOSE 119* 109* 116* 174* 151*  BUN 16 15 16  27* 28*  CREATININE 0.69 0.72 0.75 1.01* 0.80  CALCIUM 8.7* 8.6* 8.9 9.4 8.6*   GFR: Estimated Creatinine Clearance: 43 mL/min (by C-G formula based on SCr of 0.8 mg/dL). Liver Function Tests: Recent Labs  Lab 08/02/19 0027 08/07/19 1519 08/08/19 0433  AST 12* 35 58*  ALT 10 24 45*  ALKPHOS 63 58 55  BILITOT 0.7 0.3 0.5  PROT 7.2 6.8 6.0*  ALBUMIN 3.4* 3.3* 3.0*   No results for input(s): LIPASE, AMYLASE in the last 168 hours. No results for input(s): AMMONIA in the last 168 hours. Coagulation Profile: No results for input(s): INR, PROTIME in the last 168 hours. Cardiac Enzymes: No results for input(s): CKTOTAL, CKMB, CKMBINDEX, TROPONINI in the last 168 hours. BNP (last 3  results) No results for input(s): PROBNP in the last 8760 hours. HbA1C: No results for input(s): HGBA1C in the last 72 hours. CBG: Recent Labs  Lab 08/07/19 0735 08/07/19 1532 08/07/19 1722 08/07/19 2356 08/08/19 0828  GLUCAP 113* 153* 113* 149* 141*   Lipid Profile: No results for input(s): CHOL, HDL, LDLCALC, TRIG, CHOLHDL, LDLDIRECT in the last 72 hours. Thyroid Function Tests: No results for input(s): TSH, T4TOTAL, FREET4, T3FREE, THYROIDAB in the last 72 hours. Anemia Panel: No results for input(s): VITAMINB12, FOLATE, FERRITIN, TIBC, IRON, RETICCTPCT in the last 72 hours. Urine analysis:    Component Value Date/Time   COLORURINE YELLOW 08/07/2019 1600   APPEARANCEUR TURBID (A) 08/07/2019 1600   APPEARANCEUR Hazy 09/02/2013 0055   LABSPEC 1.017 08/07/2019 1600   LABSPEC 1.010 09/02/2013 0055   PHURINE 5.0 08/07/2019 1600   GLUCOSEU NEGATIVE 08/07/2019 1600   GLUCOSEU 50 mg/dL 09/02/2013 0055   HGBUR MODERATE (A) 08/07/2019 1600   BILIRUBINUR NEGATIVE 08/07/2019 1600  BILIRUBINUR Negative 09/02/2013 0055   KETONESUR NEGATIVE 08/07/2019 1600   PROTEINUR 30 (A) 08/07/2019 1600   NITRITE POSITIVE (A) 08/07/2019 1600   LEUKOCYTESUR MODERATE (A) 08/07/2019 1600   LEUKOCYTESUR Trace 09/02/2013 0055   Recent Results (from the past 240 hour(s))  SARS Coronavirus 2 by RT PCR (hospital order, performed in [place] hospital lab) Nasopharyngeal Nasopharyngeal Swab     Status: None   Collection Time: 08/01/19  2:31 PM   Specimen: Nasopharyngeal Swab  Result Value Ref Range Status   SARS Coronavirus 2 NEGATIVE NEGATIVE Final    Comment: (NOTE) SARS-CoV-2 target nucleic acids are NOT DETECTED.  The SARS-CoV-2 RNA is generally detectable in upper and lower respiratory specimens during the acute phase of infection. The lowest concentration of SARS-CoV-2 viral copies this assay can detect is 250 copies / mL. A negative result does not preclude SARS-CoV-2 infection and  should not be used as the sole basis for treatment or other patient management decisions.  A negative result may occur with improper specimen collection / handling, submission of specimen other than nasopharyngeal swab, presence of viral mutation(s) within the areas targeted by this assay, and inadequate number of viral copies (<250 copies / mL). A negative result must be combined with clinical observations, patient history, and epidemiological information.  Fact Sheet for Patients:   StrictlyIdeas.no  Fact Sheet for Healthcare Providers: BankingDealers.co.za  This test is not yet approved or  cleared by the [place] FDA and has been authorized for detection and/or diagnosis of SARS-CoV-2 by FDA under an Emergency Use Authorization (EUA).  This EUA will remain in effect (meaning this test can be used) for the duration of the COVID-19 declaration under Section 564(b)(1) of the Act, 21 U.S.C. section 360bbb-3(b)(1), unless the authorization is terminated or revoked sooner.  Performed at [facility], [address] [place], [place] [address]       Radiology Studies: CT ABDOMEN PELVIS WO CONTRAST  Result Date: 08/07/2019 CLINICAL DATA:  Abdominal pain, SBO EXAM: CT ABDOMEN AND PELVIS WITHOUT CONTRAST TECHNIQUE: Multidetector CT imaging of the abdomen and pelvis was performed following the standard protocol without IV contrast. COMPARISON:  CT 08/01/2019 FINDINGS: Lower chest: Scarring versus atelectasis in the lingula. Additional atelectatic features in the left lower lobe adjacent the large left hiatal hernia. Normal cardiac size. Extensive three-vessel coronary artery disease is noted. No pericardial effusion. Hepatobiliary: No visible focal liver lesion. Normal liver attenuation with smooth surface contour. Post cholecystectomy. No biliary ductal dilatation or visible intraductal gallstones accounting for age and reservoir  effect. Pancreas: Partial fatty replacement of the pancreas. No pancreatic ductal dilatation or surrounding inflammatory changes. Spleen: Normal in size. No concerning splenic lesions. Adrenals/Urinary Tract: Stable mild lobular thickening of the adrenal glands, possibly some senescent hyperplasia, unchanged from prior without concerning dominant adrenal nodule. Kidneys are normally located. Stable mild bilateral perinephric stranding, nonspecific. No concerning renal mass. No urolithiasis or hydronephrosis. Urinary bladder is largely decompressed at the time of exam and therefore poorly evaluated by CT imaging. Mild wall thickening likely related to underdistention. Bladder evaluation also partially limited by extensive streak artifact from a right hip prosthesis. Stomach/Bowel: Large fluid-filled hiatal hernia. Air and fluid distention of the distal stomach as well. Borderline dilatation of the duodenum with air and fluid distention of nearly the entirety of the small bowel from jejunum to terminal ileum aside from a single partially decompressed segment in the right hemiabdomen though this loop did appear dilated on prior  imaging and there is distension both upstream and downstream from this finding. Some irregular thickening and inflammation is again seen centered upon the ileocecal valve. Colon is largely decompressed. Scattered colonic diverticula without focal inflammation to suggest diverticulitis. Vascular/Lymphatic: Atherosclerotic calcifications within the abdominal aorta and branch vessels. No aneurysm or ectasia. No enlarged abdominopelvic lymph nodes. Reproductive: Uterus is surgically absent. No concerning adnexal lesions. Other: Inflammatory changes in the mesentery of the right lower quadrant and about the ileocecal valve. Trace free fluid in the mesenteric leaflets. No discernible pneumatosis or portal venous gas is evident. No abdominopelvic free air. No bowel containing hernias. Musculoskeletal:  Severe levocurvature of the lumbar spine with multilevel discogenic and facet degenerative changes are similar to prior. Prior right hip total arthroplasty with lateral acetabular screw fixation proud of the cortical surface, unchanged appearance from prior without other complication. Additional degenerative changes in the SI joints and left hip. IMPRESSION: 1. Evidence of persistent small bowel obstruction with most focal transition at the level of the cecum which demonstrates irregular thickening and surrounding inflammatory changes. Colon is largely decompressed. No evidence of perforation, discernible pneumatosis or portal venous gas is evident. Consider direct visualization following resolution of the immediate symptoms. 2. Large fluid-filled hiatal hernia. 3. Colonic diverticulosis without evidence of diverticulitis. 4. Prior right hip total arthroplasty with lateral acetabular screw fixation proud of the cortical surface, unchanged from prior. 5. Aortic Atherosclerosis (ICD10-I70.0). 6. Coronary artery atherosclerosis. Electronically Signed   By: Lovena Le M.D.   On: 08/07/2019 19:10   DG Chest Port 1 View  Result Date: 08/07/2019 CLINICAL DATA:  Pt has onset tachycardia and Hx of HTN EXAM: PORTABLE CHEST 1 VIEW COMPARISON:  August 01, 2019; January 15, 2018 FINDINGS: The cardiomediastinal silhouette is unchanged in contour.Large hiatal hernia. Unchanged elevation of the right hemidiaphragm. No pleural effusion. No pneumothorax. No acute pleuroparenchymal abnormality. Surgical clips project over the right upper quadrant. Multilevel degenerative changes of the thoracic spine. Likely remote posttraumatic changes of the right shoulder. IMPRESSION: No acute cardiopulmonary abnormality.  Large hiatal hernia. Electronically Signed   By: Valentino Saxon MD   On: 08/07/2019 15:40    Scheduled Meds:  heparin injection (subcutaneous)  5,000 Units Subcutaneous Q8H   pneumococcal 23 valent vaccine  0.5 mL  Intramuscular Tomorrow-1000   sodium chloride flush  3 mL Intravenous Once   Continuous Infusions:  sodium chloride 125 mL/hr at 08/08/19 1011   cefTRIAXone (ROCEPHIN)  IV 1 g (08/07/19 1753)     LOS: 7 days   Time spent: 35 minutes.  Patrecia Pour, MD [facility] [email or web address] 08/08/2019, 2:56 PM

## 2019-08-08 NOTE — Progress Notes (Signed)
Physical Therapy Treatment Patient Details Name: Amber Hunter MRN: [id number] DOB: 05-Sep-1928 Today's Date: 08/08/2019    History of Present Illness Pt is 86 y.o. female with history of hypertension, chronic kidney disease stage III, breast cancer in remission, restless leg syndrome, chronic pain on tramadol presents to the ER with complaints of nausea vomiting abdominal pain for last 3 days.  Pt admitted with SBO likely from adhesive disease.  Pt is currently with NG tube, no surgical intervention at this time.    PT Comments    Pt in bed watching TV with resting HR 125 RN reported "family would like her up to the chair" General bed mobility comments: heavy reliance on rail, incr time plus use of bed pad to complete scooting to EOB.  Once upright, pt was able to static sit EOB with Supervision but present with Kyphotic forward flex posture (head down).  General transfer comment: assisted from elevated bed to [facility] 1/4 turn then again from [facility] to recliner.  Transfers only this session due to elevated HR with activity.  Highest seen was 164.  Range 134 - 158 and increased RR to 40.  RN aware as she came to room after [facility] Box notified her. Pt positioned in recliner with multiple pillows.    Follow Up Recommendations  Home health PT;Supervision for mobility/OOB     Equipment Recommendations  Rolling walker with 5" wheels    Recommendations for Other Services       Precautions / Restrictions Precautions Precautions: Fall Precaution Comments: NPO NG Tube Kyphotic Restrictions Weight Bearing Restrictions: No    Mobility  Bed Mobility Overal bed mobility: Needs Assistance Bed Mobility: Supine to Sit     Supine to sit: Mod assist;Max assist     General bed mobility comments: heavy reliance on rail, incr time plus use of bed pad to complete scooting to EOB.  Once upright, pt was able to static sit EOB with Supervision but present with Kyphotic forward flex posture (head  down)  Transfers Overall transfer level: Needs assistance Equipment used: 1 person hand held assist Transfers: Sit to/from [facility] Sit to Stand: Min assist Stand pivot transfers: Min assist;Mod assist       General transfer comment: assisted from elevated bed to [facility] 1/4 turn then again from [facility] to recliner.  Transfers only this session due to elevated HR with activity.  Highest seen was 164.  Range 134 - 158 and increased RR to 40.  RN aware as she came to room after [facility] Box notified her.  Ambulation/Gait             General Gait Details: unable to attempt amb due to Tachycardia and RR   Stairs             Wheelchair Mobility    Modified Rankin (Stroke Patients Only)       Balance                                            Cognition     Overall Cognitive Status: Within Functional Limits for tasks assessed                                 General Comments: AxO x 3 very sweet      Exercises  General Comments        Pertinent Vitals/Pain Pain Assessment: Faces Faces Pain Scale: Hurts a little bit Pain Location: stomach Pain Descriptors / Indicators: Discomfort;Grimacing Pain Intervention(s): Monitored during session;Repositioned    Home Living                      Prior Function            PT Goals (current goals can now be found in the care plan section) Progress towards PT goals: Progressing toward goals    Frequency    Min 3X/week      PT Plan Current plan remains appropriate    Co-evaluation              AM-PAC PT "6 Clicks" Mobility   Outcome Measure  Help needed turning from your back to your side while in a flat bed without using bedrails?: A Lot Help needed moving from lying on your back to sitting on the side of a flat bed without using bedrails?: A Lot Help needed moving to and from a bed to a chair (including a wheelchair)?: A Lot Help needed  standing up from a chair using your arms (e.g., wheelchair or bedside chair)?: A Lot Help needed to walk in hospital room?: A Lot Help needed climbing 3-5 steps with a railing? : Total 6 Click Score: 11    End of Session Equipment Utilized During Treatment: Gait belt Activity Tolerance: Treatment limited secondary to medical complications (Comment) Patient left: in chair;with call bell/phone within reach Nurse Communication: Mobility status PT Visit Diagnosis: Unsteadiness on feet (R26.81);Muscle weakness (generalized) (M62.81)     Time: 8756-4332 PT Time Calculation (min) (ACUTE ONLY): 31 min  Charges:  $Therapeutic Activity: 23-37 mins                     Rica Koyanagi  PTA Acute  Rehabilitation Services Pager      [phone] Office      [phone]

## 2019-08-09 ENCOUNTER — Inpatient Hospital Stay (HOSPITAL_COMMUNITY): Payer: Medicare Other

## 2019-08-09 LAB — PREPARE RBC (CROSSMATCH)

## 2019-08-09 LAB — COMPREHENSIVE METABOLIC PANEL
ALT: 67 U/L — ABNORMAL HIGH (ref 0–44)
AST: 57 U/L — ABNORMAL HIGH (ref 15–41)
Albumin: 2.6 g/dL — ABNORMAL LOW (ref 3.5–5.0)
Alkaline Phosphatase: 51 U/L (ref 38–126)
Anion gap: 12 (ref 5–15)
BUN: 34 mg/dL — ABNORMAL HIGH (ref 8–23)
CO2: 23 mmol/L (ref 22–32)
Calcium: 7.8 mg/dL — ABNORMAL LOW (ref 8.9–10.3)
Chloride: 106 mmol/L (ref 98–111)
Creatinine, Ser: 1.13 mg/dL — ABNORMAL HIGH (ref 0.44–1.00)
GFR calc Af Amer: 50 mL/min — ABNORMAL LOW (ref >60)
GFR calc non Af Amer: 43 mL/min — ABNORMAL LOW (ref >60)
Glucose, Bld: 120 mg/dL — ABNORMAL HIGH (ref 70–99)
Potassium: 3.4 mmol/L — ABNORMAL LOW (ref 3.5–5.1)
Sodium: 141 mmol/L (ref 135–145)
Total Bilirubin: 0.5 mg/dL (ref 0.3–1.2)
Total Protein: 5.5 g/dL — ABNORMAL LOW (ref 6.5–8.1)

## 2019-08-09 LAB — BLOOD GAS, ARTERIAL
Acid-Base Excess: 2.3 mmol/L — ABNORMAL HIGH (ref 0.0–2.0)
Bicarbonate: 25.4 mmol/L (ref 20.0–28.0)
Drawn by: 29503
FIO2: 21
O2 Saturation: 94.3 %
Patient temperature: 98.6
pCO2 arterial: 34.6 mmHg (ref 32.0–48.0)
pH, Arterial: 7.48 — ABNORMAL HIGH (ref 7.350–7.450)
pO2, Arterial: 67.4 mmHg — ABNORMAL LOW (ref 83.0–108.0)

## 2019-08-09 LAB — URINE CULTURE: Culture: >=100000 — AB

## 2019-08-09 LAB — CBC WITH DIFFERENTIAL/PLATELET
Abs Immature Granulocytes: 0.21 10*3/uL — ABNORMAL HIGH (ref 0.00–0.07)
Basophils Absolute: 0.1 10*3/uL (ref 0.0–0.1)
Basophils Relative: 0 %
Eosinophils Absolute: 0 10*3/uL (ref 0.0–0.5)
Eosinophils Relative: 0 %
HCT: 23.7 % — ABNORMAL LOW (ref 36.0–46.0)
Hemoglobin: 7 g/dL — ABNORMAL LOW (ref 12.0–15.0)
Immature Granulocytes: 1 %
Lymphocytes Relative: 11 %
Lymphs Abs: 2.8 10*3/uL (ref 0.7–4.0)
MCH: 25.4 pg — ABNORMAL LOW (ref 26.0–34.0)
MCHC: 29.5 g/dL — ABNORMAL LOW (ref 30.0–36.0)
MCV: 85.9 fL (ref 80.0–100.0)
Monocytes Absolute: 1 10*3/uL (ref 0.1–1.0)
Monocytes Relative: 4 %
Neutro Abs: 22.4 10*3/uL — ABNORMAL HIGH (ref 1.7–7.7)
Neutrophils Relative %: 84 %
Platelets: 312 10*3/uL (ref 150–400)
RBC: 2.76 MIL/uL — ABNORMAL LOW (ref 3.87–5.11)
RDW: 15.3 % (ref 11.5–15.5)
WBC: 26.6 10*3/uL — ABNORMAL HIGH (ref 4.0–10.5)
nRBC: 0 % (ref 0.0–0.2)

## 2019-08-09 LAB — ABO/RH: ABO/RH(D): O POS

## 2019-08-09 LAB — GLUCOSE, CAPILLARY
Glucose-Capillary: 101 mg/dL — ABNORMAL HIGH (ref 70–99)
Glucose-Capillary: 106 mg/dL — ABNORMAL HIGH (ref 70–99)
Glucose-Capillary: 110 mg/dL — ABNORMAL HIGH (ref 70–99)
Glucose-Capillary: 130 mg/dL — ABNORMAL HIGH (ref 70–99)

## 2019-08-09 MED ORDER — KCL IN DEXTROSE-NACL 20-5-0.9 MEQ/L-%-% IV SOLN
INTRAVENOUS | Status: DC
  Filled 2019-08-09 (×3): qty 1000

## 2019-08-09 MED ORDER — SODIUM CHLORIDE 0.9% IV SOLUTION: Freq: Once | INTRAVENOUS | Status: AC

## 2019-08-09 NOTE — Progress Notes (Addendum)
Subjective:  Denies pain. Thinks she had a small amt flatus this AM, unsure if she had a BM last night or the night before. Reports eating 4-5 cups of ice since NG placement.   Objective: Vital signs in last 24 hours: Temp:  [97.5 F (36.4 C)-101 F (38.3 C)] 97.5 F (36.4 C) (07/22 0541) Pulse Rate:  [108-128] 108 (07/22 0541) Resp:  [20-30] 26 (07/22 0541) BP: (131-154)/(79-85) 154/84 (07/22 0541) SpO2:  [90 %-95 %] 95 % (07/22 0541) Last BM Date: 08/06/19  Intake/Output from previous day: 07/21 0701 - 07/22 0700 In: 0  Out: 2350 [Emesis/NG output:2350] Intake/Output this shift: No intake/output data recorded.  PE: Gen: alert, cooperative, NAD Pulm: normal effort  Abd: soft, mild distention, +BS, no peritonitis.  Lab Results:  Recent Labs    08/08/19 0433 08/09/19 0509  WBC 27.4* 26.6*  HGB 8.3* 7.0*  HCT 27.6* 23.7*  PLT 377 312   BMET Recent Labs    08/08/19 0433 08/09/19 0509  NA 139 141  K 3.7 3.4*  CL 105 106  CO2 24 23  GLUCOSE 151* 120*  BUN 28* 34*  CREATININE 0.80 1.13*  CALCIUM 8.6* 7.8*   PT/INR No results for input(s): LABPROT, INR in the last 72 hours. CMP     Component Value Date/Time   NA 141 08/09/2019 0509   NA 143 09/03/2013 0433   K 3.4 (L) 08/09/2019 0509   K 3.9 09/03/2013 0433   CL 106 08/09/2019 0509   CL 112 (H) 09/03/2013 0433   CO2 23 08/09/2019 0509   CO2 22 09/03/2013 0433   GLUCOSE 120 (H) 08/09/2019 0509   GLUCOSE 124 (H) 09/03/2013 0433   BUN 34 (H) 08/09/2019 0509   BUN 11 09/03/2013 0433   CREATININE 1.13 (H) 08/09/2019 0509   CREATININE 1.04 09/03/2013 0433   CALCIUM 7.8 (L) 08/09/2019 0509   CALCIUM 8.1 (L) 09/03/2013 0433   PROT 5.5 (L) 08/09/2019 0509   PROT 8.1 09/01/2013 2320   ALBUMIN 2.6 (L) 08/09/2019 0509   ALBUMIN 3.9 09/01/2013 2320   AST 57 (H) 08/09/2019 0509   AST 39 (H) 09/01/2013 2320   ALT 67 (H) 08/09/2019 0509   ALT 28 09/01/2013 2320   ALKPHOS 51 08/09/2019 0509   ALKPHOS  72 09/01/2013 2320   BILITOT 0.5 08/09/2019 0509   BILITOT 0.4 09/01/2013 2320   GFRNONAA 43 (L) 08/09/2019 0509   GFRNONAA 50 (L) 09/03/2013 0433   GFRAA 50 (L) 08/09/2019 0509   GFRAA 58 (L) 09/03/2013 0433   Lipase     Component Value Date/Time   LIPASE 17 08/01/2019 1431   LIPASE 146 09/01/2013 2320       Studies/Results: CT ABDOMEN PELVIS WO CONTRAST  Result Date: 08/07/2019 CLINICAL DATA:  Abdominal pain, SBO EXAM: CT ABDOMEN AND PELVIS WITHOUT CONTRAST TECHNIQUE: Multidetector CT imaging of the abdomen and pelvis was performed following the standard protocol without IV contrast. COMPARISON:  CT 08/01/2019 FINDINGS: Lower chest: Scarring versus atelectasis in the lingula. Additional atelectatic features in the left lower lobe adjacent the large left hiatal hernia. Normal cardiac size. Extensive three-vessel coronary artery disease is noted. No pericardial effusion. Hepatobiliary: No visible focal liver lesion. Normal liver attenuation with smooth surface contour. Post cholecystectomy. No biliary ductal dilatation or visible intraductal gallstones accounting for age and reservoir effect. Pancreas: Partial fatty replacement of the pancreas. No pancreatic ductal dilatation or surrounding inflammatory changes. Spleen: Normal in size. No concerning splenic  lesions. Adrenals/Urinary Tract: Stable mild lobular thickening of the adrenal glands, possibly some senescent hyperplasia, unchanged from prior without concerning dominant adrenal nodule. Kidneys are normally located. Stable mild bilateral perinephric stranding, nonspecific. No concerning renal mass. No urolithiasis or hydronephrosis. Urinary bladder is largely decompressed at the time of exam and therefore poorly evaluated by CT imaging. Mild wall thickening likely related to underdistention. Bladder evaluation also partially limited by extensive streak artifact from a right hip prosthesis. Stomach/Bowel: Large fluid-filled hiatal hernia.  Air and fluid distention of the distal stomach as well. Borderline dilatation of the duodenum with air and fluid distention of nearly the entirety of the small bowel from jejunum to terminal ileum aside from a single partially decompressed segment in the right hemiabdomen though this loop did appear dilated on prior imaging and there is distension both upstream and downstream from this finding. Some irregular thickening and inflammation is again seen centered upon the ileocecal valve. Colon is largely decompressed. Scattered colonic diverticula without focal inflammation to suggest diverticulitis. Vascular/Lymphatic: Atherosclerotic calcifications within the abdominal aorta and branch vessels. No aneurysm or ectasia. No enlarged abdominopelvic lymph nodes. Reproductive: Uterus is surgically absent. No concerning adnexal lesions. Other: Inflammatory changes in the mesentery of the right lower quadrant and about the ileocecal valve. Trace free fluid in the mesenteric leaflets. No discernible pneumatosis or portal venous gas is evident. No abdominopelvic free air. No bowel containing hernias. Musculoskeletal: Severe levocurvature of the lumbar spine with multilevel discogenic and facet degenerative changes are similar to prior. Prior right hip total arthroplasty with lateral acetabular screw fixation proud of the cortical surface, unchanged appearance from prior without other complication. Additional degenerative changes in the SI joints and left hip. IMPRESSION: 1. Evidence of persistent small bowel obstruction with most focal transition at the level of the cecum which demonstrates irregular thickening and surrounding inflammatory changes. Colon is largely decompressed. No evidence of perforation, discernible pneumatosis or portal venous gas is evident. Consider direct visualization following resolution of the immediate symptoms. 2. Large fluid-filled hiatal hernia. 3. Colonic diverticulosis without evidence of  diverticulitis. 4. Prior right hip total arthroplasty with lateral acetabular screw fixation proud of the cortical surface, unchanged from prior. 5. Aortic Atherosclerosis (ICD10-I70.0). 6. Coronary artery atherosclerosis. Electronically Signed   By: Lovena Le M.D.   On: 08/07/2019 19:10   DG Chest Port 1 View  Result Date: 08/07/2019 CLINICAL DATA:  Pt has onset tachycardia and Hx of HTN EXAM: PORTABLE CHEST 1 VIEW COMPARISON:  August 01, 2019; January 15, 2018 FINDINGS: The cardiomediastinal silhouette is unchanged in contour.Large hiatal hernia. Unchanged elevation of the right hemidiaphragm. No pleural effusion. No pneumothorax. No acute pleuroparenchymal abnormality. Surgical clips project over the right upper quadrant. Multilevel degenerative changes of the thoracic spine. Likely remote posttraumatic changes of the right shoulder. IMPRESSION: No acute cardiopulmonary abnormality.  Large hiatal hernia. Electronically Signed   By: Valentino Saxon MD   On: 08/07/2019 15:40   DG Abd Portable 1V  Result Date: 08/08/2019 CLINICAL DATA:  Nasogastric tube placement. EXAM: PORTABLE ABDOMEN - 1 VIEW COMPARISON:  08/03/2019 FINDINGS: A gastric tube has been placed, tip overlying the level of the stomach. Patient is rotated. There is significant dilatation of small bowel loops. No evidence for free intraperitoneal air. Large hiatal hernia noted. Scoliosis and degenerative changes. IMPRESSION: 1. Interval placement of gastric tube. 2. Small bowel obstruction. Electronically Signed   By: Nolon Nations M.D.   On: 08/08/2019 15:03    Anti-infectives: Anti-infectives (From admission,  onward)   Start     Dose/Rate Route Frequency Ordered Stop   08/07/19 1800  cefTRIAXone (ROCEPHIN) 1 g in sodium chloride 0.9 % 100 mL IVPB     Discontinue     1 g 200 mL/hr over 30 Minutes Intravenous Every 24 hours 08/07/19 1713         Assessment/Plan CKD, stage III H/O breast cancer HTN RLS Hiatal  hernia Chronic pain  Severe scoliosis  UTI - on rocephin Leukocytosis - 27, suspect related to above UTI   pSBO  -admitted 7/14, NG tube was placed, SB protocol showed progression of contrast to the colon and the patient clinically improved and was advanced to solid diet.  - evening of 7/20 the patient developed tachycardia, leukocytosis, repeat CT/abd showed persistent pSBO and and inflammatory process at the level of the cecum.   - patient clinically shows signs of pSBO with some flatus/stool but also nausea/vomiting/distention.   - NG tube replaced 7/21, over 2L out in 24 hours however she has also eaten 4-5 cups of ice    - given persistent, high grade, pSBO and possible cecal mass I recommend diagnostic laparoscopy, possible bowel resection to resolve obstriction and also determine source of obstruction. The patient states that she does not like the idea of surgery but would want to proceed if it is what it takes for her to get better. She will continue to discuss surgery with her son and grandson today and Dr. Lucia Gaskins and I will follow up with her in a few hours. Surgery would likely be tomorrow due to some urgent operative cases that came in overnight.    FEN - NPO, IVF, LIMITED ice chips VTE - heparin ID - none   LOS: 8 days   Jill Alexanders , [facility] Surgery 08/09/2019, 8:10 AM Please see Amion for pager number during day hours 7:00am-4:30pm or 7:00am -11:30am on weekends  Agree with above.  The patient does not want surgery right now. I called and spoke to the son, Simisola Sandles, again today.  There is some internal tension between [place] and the grandson, Governor Rooks.  There is a question of whether Aaron Edelman has her medical power of attorney.  Of note, Aaron Edelman does not have a car or license.  He has not visited his grandmother once since she has been in the hospital.  Elta Guadeloupe also said that Aaron Edelman wanted a "second opinion" - but as far as I know, Aaron Edelman has spoken to no  one about this.  I have spoke to Dr. Bonner Puna about the options and the family dynamics.  Alphonsa Overall, MD, [facility] Office phone:  [phone]

## 2019-08-09 NOTE — Progress Notes (Signed)
PT Cancellation Note  Patient Details Name: Taffie Eckmann MRN: [id number] DOB: [birth year]   Cancelled Treatment:     pt unable to tolerate today.  Receiving blood  then schedule for diagnostic laparoscopy, possible bowel resection to resolve obstriction and also determine source of obstruction.  Surgery would likely be tomorrow.  Will continue to follow her case.     Rica Koyanagi  PTA Acute  Rehabilitation Services Pager      [phone] Office      [phone]

## 2019-08-09 NOTE — Progress Notes (Signed)
PROGRESS NOTE  Amber Hunter  NOM:[id number] DOB: 1928/12/27 DOA: 08/01/2019 PCP: Delrae Alfred, DO   Brief Narrative: Amber Hunter is an 86 y.o. female with a history of stage IIIa CKD, breast CA in remission, RLS, chronic pain who presented to [facility] 7/14 with nausea, vomiting, abdominal pain found to have imaging evidence of partial small bowel obstruction. Shew was admitted to the hospitalist service and treated conservatively per general surgery recommendations including NG tube decompression, IV fluids, and bowel rest. After passing clamping trial and seeing resolution of leukocytosis, NG tube was pulled and diet slowly advanced. On 7/20, the patient developed significant sinus tachycardia and abdominal pain with rising WBC. Ceftriaxone was started for nitrite-positive pyuria and bacteriuria. CT abd/pelvis was repeated showing persistent small bowel obstruction with transition point at a point of irregular thickening at the cecum. General surgery was reconsulted.  Assessment & Plan: Principal Problem:   Partial small bowel obstruction (HCC) Active Problems:   Essential hypertension   SBO (small bowel obstruction) (HCC)  SBO: With transition point near cecum which is irregularly thickened (?mass vs. inflammation). Previously suspected to be due to adhesions s/p appendectomy and hysterectomy.  - Appreciate surgery's recommendations regarding direct visualization of the area in question.  - Confirmed patient's desire to proceed with surgery as last resort.  - NPO, IVF, will add dextrose to minimize catabolic state. Still with significant NG output.  Sepsis due to Klebsiella UTI: Leukocytosis and sinus tachycardia satisfy SIRS. Also note bilateral perinephric stranding and bladder wall thickening on CT. - Continue ceftriaxone pending susceptibilities.  - Check blood cultures w/ Tmax 101F   Anemia of acute illness on anemia of chronic disease: No bleeding noted. Hgb 7 this AM and likely  going to surgery. With tachycardia, suspect this represents symptomatic anemia.  - Transfuse 1u PRBCs, recheck CBC in AM  HTN:  - Holding lisinopril, HCTZ for now, BP not severely elevated.  AKI on stage IIIa CKD: Improved with fluids which will need to be restarted. Note prerenal FENa. No hydronephrosis on CT. - Monitor, continue IVF.   Hypokalemia due to GI losses:  - Add to IVF.   Hyperglycemia due to prediabetes: Technically meets definition with HbA1c 5.8%.  - Optimize glycemic control.   Breast CA: In remission  RLS:  - Holding mirapex while NPO.  Lactic acidosis: POA, resolved.   Hypokalemia: Resolved.   Depression:  - Hold citalopram while NPO.   LFT elevation: Likely due to sepsis. Will recheck intermittently. Liver appeared normal on CT  DVT prophylaxis: Heparin Code Status: Full confirmed with patient Family Communication: None at bedside. Surgery speaking Re: surgical interventions w/family. Disposition Plan:  Status is: Inpatient  Remains inpatient appropriate because:Hemodynamically unstable, Ongoing active pain requiring inpatient pain management, IV treatments appropriate due to intensity of illness or inability to take PO and Inpatient level of care appropriate due to severity of illness   Dispo: The patient is from: Home              Anticipated d/c is to: Home              Anticipated d/c date is: 3 days              Patient currently is not medically stable to d/c.  Consultants:   General surgery  Procedures:   None  Antimicrobials:  Ceftriaxone 7/20 >>    Subjective: Became very lethargic after returning from XR downstairs. No sedating medication administered. CBG wnl, ABG without retention. Able  to follow minimal commands. CT head ordered but later cancelled when repeat evaluation showed her back to her mental baseline. Denies abdominal pain at this time. No dyspnea.   Objective: Vitals:   08/09/19 1051 08/09/19 1113 08/09/19 1127  08/09/19 1354  BP: (!) 156/71 (!) 158/78 (!) 158/78 (!) 157/80  Pulse: 102 103 103 (!) 106  Resp: 22 22 23 22   Temp: 98.6 F (37 C) 98.2 F (36.8 C) 98.2 F (36.8 C) 98.4 F (36.9 C)  TempSrc: Oral  Axillary Oral  SpO2: 96%  90% 99%  Weight:      Height:        Intake/Output Summary (Last 24 hours) at 08/09/2019 1855 Last data filed at 08/09/2019 1400 Gross per 24 hour  Intake 1035.61 ml  Output 800 ml  Net 235.61 ml   Filed Weights   08/01/19 1405  Weight: 64.4 kg   Gen: Elderly female in no distress Pulm: Nonlabored breathing, tachypneic without crackles or wheezes. CV: Regular borderline tachycardia. No murmur, rub, or gallop. No JVD, no dependent edema. GI: Abdomen soft, diffusely tender, somewhat distended, with normoactive bowel sounds.  Ext: Warm, no deformities Skin: No new rashes, lesions or ulcers on visualized skin. Neuro: Alert and oriented on second evaluation. No focal neurological deficits on repeat evaluation. Psych: Judgement and insight appear fair. Mood euthymic & affect congruent. Behavior is appropriate.    Data Reviewed: I have personally reviewed following labs and imaging studies  CBC: Recent Labs  Lab 08/06/19 0534 08/07/19 0454 08/07/19 1519 08/08/19 0433 08/09/19 0509  WBC 13.6* 13.0* 17.3* 27.4* 26.6*  NEUTROABS  --   --   --   --  22.4*  HGB 8.7* 8.2* 9.1* 8.3* 7.0*  HCT 28.0* 27.3* 30.0* 27.6* 23.7*  MCV 81.9 83.7 83.3 84.1 85.9  PLT 382 331 437* 377 916   Basic Metabolic Panel: Recent Labs  Lab 08/06/19 1613 08/07/19 0454 08/07/19 1519 08/08/19 0433 08/09/19 0509  NA 138 138 139 139 141  K 3.6 3.5 4.0 3.7 3.4*  CL 102 104 100 105 106  CO2 26 26 27 24 23   GLUCOSE 109* 116* 174* 151* 120*  BUN 15 16 27* 28* 34*  CREATININE 0.72 0.75 1.01* 0.80 1.13*  CALCIUM 8.6* 8.9 9.4 8.6* 7.8*   GFR: Estimated Creatinine Clearance: 30.5 mL/min (A) (by C-G formula based on SCr of 1.13 mg/dL (H)). Liver Function Tests: Recent Labs    Lab 08/07/19 1519 08/08/19 0433 08/09/19 0509  AST 35 58* 57*  ALT 24 45* 67*  ALKPHOS 58 55 51  BILITOT 0.3 0.5 0.5  PROT 6.8 6.0* 5.5*  ALBUMIN 3.3* 3.0* 2.6*   No results for input(s): LIPASE, AMYLASE in the last 168 hours. No results for input(s): AMMONIA in the last 168 hours. Coagulation Profile: No results for input(s): INR, PROTIME in the last 168 hours. Cardiac Enzymes: No results for input(s): CKTOTAL, CKMB, CKMBINDEX, TROPONINI in the last 168 hours. BNP (last 3 results) No results for input(s): PROBNP in the last 8760 hours. HbA1C: No results for input(s): HGBA1C in the last 72 hours. CBG: Recent Labs  Lab 08/08/19 0828 08/09/19 0012 08/09/19 0744 08/09/19 1136 08/09/19 1559  GLUCAP 141* 130* 101* 110* 106*   Lipid Profile: No results for input(s): CHOL, HDL, LDLCALC, TRIG, CHOLHDL, LDLDIRECT in the last 72 hours. Thyroid Function Tests: No results for input(s): TSH, T4TOTAL, FREET4, T3FREE, THYROIDAB in the last 72 hours. Anemia Panel: No results for input(s): VITAMINB12, FOLATE, FERRITIN, TIBC,  IRON, RETICCTPCT in the last 72 hours. Urine analysis:    Component Value Date/Time   COLORURINE YELLOW 08/07/2019 1600   APPEARANCEUR TURBID (A) 08/07/2019 1600   APPEARANCEUR Hazy 09/02/2013 0055   LABSPEC 1.017 08/07/2019 1600   LABSPEC 1.010 09/02/2013 0055   PHURINE 5.0 08/07/2019 1600   GLUCOSEU NEGATIVE 08/07/2019 1600   GLUCOSEU 50 mg/dL 09/02/2013 0055   HGBUR MODERATE (A) 08/07/2019 1600   BILIRUBINUR NEGATIVE 08/07/2019 1600   BILIRUBINUR Negative 09/02/2013 0055   KETONESUR NEGATIVE 08/07/2019 1600   PROTEINUR 30 (A) 08/07/2019 1600   NITRITE POSITIVE (A) 08/07/2019 1600   LEUKOCYTESUR MODERATE (A) 08/07/2019 1600   LEUKOCYTESUR Trace 09/02/2013 0055   Recent Results (from the past 240 hour(s))  SARS Coronavirus 2 by RT PCR (hospital order, performed in [place] hospital lab) Nasopharyngeal Nasopharyngeal Swab     Status: None    Collection Time: 08/01/19  2:31 PM   Specimen: Nasopharyngeal Swab  Result Value Ref Range Status   SARS Coronavirus 2 NEGATIVE NEGATIVE Final    Comment: (NOTE) SARS-CoV-2 target nucleic acids are NOT DETECTED.  The SARS-CoV-2 RNA is generally detectable in upper and lower respiratory specimens during the acute phase of infection. The lowest concentration of SARS-CoV-2 viral copies this assay can detect is 250 copies / mL. A negative result does not preclude SARS-CoV-2 infection and should not be used as the sole basis for treatment or other patient management decisions.  A negative result may occur with improper specimen collection / handling, submission of specimen other than nasopharyngeal swab, presence of viral mutation(s) within the areas targeted by this assay, and inadequate number of viral copies (<250 copies / mL). A negative result must be combined with clinical observations, patient history, and epidemiological information.  Fact Sheet for Patients:   StrictlyIdeas.no  Fact Sheet for Healthcare Providers: BankingDealers.co.za  This test is not yet approved or  cleared by the [place] FDA and has been authorized for detection and/or diagnosis of SARS-CoV-2 by FDA under an Emergency Use Authorization (EUA).  This EUA will remain in effect (meaning this test can be used) for the duration of the COVID-19 declaration under Section 564(b)(1) of the Act, 21 U.S.C. section 360bbb-3(b)(1), unless the authorization is terminated or revoked sooner.  Performed at [facility], [place] [address] [place], [place] [address]   Culture, Urine     Status: Abnormal   Collection Time: 08/07/19  4:00 PM   Specimen: Urine, Random  Result Value Ref Range Status   Specimen Description   Final    URINE, RANDOM Performed at [facility], [place] [address] [place], [place] [address]    Special Requests   Final      add on to earlier sample Performed at [facility], [place] [address] [place], [place] [address]    Culture >=100,000 COLONIES/mL KLEBSIELLA PNEUMONIAE (A)  Final   Report Status 08/09/2019 FINAL  Final   Organism ID, Bacteria KLEBSIELLA PNEUMONIAE (A)  Final      Susceptibility   Klebsiella pneumoniae - MIC*    AMPICILLIN RESISTANT Resistant     CEFAZOLIN <=4 SENSITIVE Sensitive     CEFTRIAXONE <=0.25 SENSITIVE Sensitive     CIPROFLOXACIN <=0.25 SENSITIVE Sensitive     GENTAMICIN <=1 SENSITIVE Sensitive     IMIPENEM <=0.25 SENSITIVE Sensitive     NITROFURANTOIN 32 SENSITIVE Sensitive     TRIMETH/SULFA <=20 SENSITIVE Sensitive     AMPICILLIN/SULBACTAM 4 SENSITIVE Sensitive  PIP/TAZO <=4 SENSITIVE Sensitive     * >=100,000 COLONIES/mL KLEBSIELLA PNEUMONIAE  Culture, blood (routine x 2)     Status: None (Preliminary result)   Collection Time: 08/09/19  8:50 AM   Specimen: BLOOD  Result Value Ref Range Status   Specimen Description   Final    BLOOD RIGHT ARM Performed at [facility] [address], [place], [place] [address]    Special Requests   Final    BOTTLES DRAWN AEROBIC ONLY Blood Culture results may not be optimal due to an inadequate volume of blood received in culture bottles Performed at [facility] [address] [place], [place] [address]    Culture   Final    NO GROWTH < 12 HOURS Performed at [facility] [address], [place],  [address]    Report Status PENDING  Incomplete      Radiology Studies: DG Abd 2 Views  Result Date: 08/09/2019 CLINICAL DATA:  Possible intraperitoneal free air. EXAM: ABDOMEN - 2 VIEW COMPARISON:  08/08/2019 FINDINGS: Supine abdomen shows mild gaseous distention of small bowel in the central abdomen with loops measuring up to 4 cm diameter. Right-side-up decubitus film shows no intraperitoneal free air. Air-fluid levels are seen in the distended central  small bowel loops. Bones are diffusely demineralized with convex leftward upper lumbar scoliosis. Patient is status post right hip replacement. IMPRESSION: 1. No evidence for intraperitoneal free air on the right-side-up decubitus film. 2. Mild gaseous distention of small bowel loops with air-fluid levels, minimally decreased since 08/08/2019. Ileus or obstruction not excluded. Electronically Signed   By: Misty Stanley M.D.   On: 08/09/2019 11:58   DG Abd Portable 1V  Result Date: 08/08/2019 CLINICAL DATA:  Nasogastric tube placement. EXAM: PORTABLE ABDOMEN - 1 VIEW COMPARISON:  08/03/2019 FINDINGS: A gastric tube has been placed, tip overlying the level of the stomach. Patient is rotated. There is significant dilatation of small bowel loops. No evidence for free intraperitoneal air. Large hiatal hernia noted. Scoliosis and degenerative changes. IMPRESSION: 1. Interval placement of gastric tube. 2. Small bowel obstruction. Electronically Signed   By: Nolon Nations M.D.   On: 08/08/2019 15:03    Scheduled Meds: . heparin injection (subcutaneous)  5,000 Units Subcutaneous Q8H  . pneumococcal 23 valent vaccine  0.5 mL Intramuscular Tomorrow-1000  . sodium chloride flush  3 mL Intravenous Once   Continuous Infusions: . cefTRIAXone (ROCEPHIN)  IV 1 g (08/09/19 1720)  . dextrose 5 % and 0.9 % NaCl with KCl 20 mEq/L 100 mL/hr at 08/09/19 0959     LOS: 8 days   Time spent: 35 minutes.  Patrecia Pour, MD [facility] [email or web address] 08/09/2019, 6:55 PM

## 2019-08-09 NOTE — Care Management Important Message (Signed)
Important Message  Patient Details IM Letter given to Gabriel Earing RN Case Manager to present to the Patient Name: Amber Hunter MRN: [id number] Date of Birth: 01/20/1928   Medicare Important Message Given:  Yes     Kerin Salen 08/09/2019, 9:38 AM

## 2019-08-10 LAB — CBC WITH DIFFERENTIAL/PLATELET
Abs Immature Granulocytes: 0.17 10*3/uL — ABNORMAL HIGH (ref 0.00–0.07)
Basophils Absolute: 0 10*3/uL (ref 0.0–0.1)
Basophils Relative: 0 %
Eosinophils Absolute: 0.4 10*3/uL (ref 0.0–0.5)
Eosinophils Relative: 2 %
HCT: 27.8 % — ABNORMAL LOW (ref 36.0–46.0)
Hemoglobin: 8.6 g/dL — ABNORMAL LOW (ref 12.0–15.0)
Immature Granulocytes: 1 %
Lymphocytes Relative: 12 %
Lymphs Abs: 2.2 10*3/uL (ref 0.7–4.0)
MCH: 26.4 pg (ref 26.0–34.0)
MCHC: 30.9 g/dL (ref 30.0–36.0)
MCV: 85.3 fL (ref 80.0–100.0)
Monocytes Absolute: 0.8 10*3/uL (ref 0.1–1.0)
Monocytes Relative: 4 %
Neutro Abs: 14.7 10*3/uL — ABNORMAL HIGH (ref 1.7–7.7)
Neutrophils Relative %: 81 %
Platelets: 295 10*3/uL (ref 150–400)
RBC: 3.26 MIL/uL — ABNORMAL LOW (ref 3.87–5.11)
RDW: 15.9 % — ABNORMAL HIGH (ref 11.5–15.5)
WBC: 18.2 10*3/uL — ABNORMAL HIGH (ref 4.0–10.5)
nRBC: 0.1 % (ref 0.0–0.2)

## 2019-08-10 LAB — BASIC METABOLIC PANEL
Anion gap: 9 (ref 5–15)
BUN: 17 mg/dL (ref 8–23)
CO2: 22 mmol/L (ref 22–32)
Calcium: 7.7 mg/dL — ABNORMAL LOW (ref 8.9–10.3)
Chloride: 108 mmol/L (ref 98–111)
Creatinine, Ser: 0.6 mg/dL (ref 0.44–1.00)
GFR calc Af Amer: >60 mL/min (ref >60)
GFR calc non Af Amer: >60 mL/min (ref >60)
Glucose, Bld: 133 mg/dL — ABNORMAL HIGH (ref 70–99)
Potassium: 3.2 mmol/L — ABNORMAL LOW (ref 3.5–5.1)
Sodium: 139 mmol/L (ref 135–145)

## 2019-08-10 LAB — PREALBUMIN: Prealbumin: 7.7 mg/dL — ABNORMAL LOW (ref 18–38)

## 2019-08-10 LAB — TYPE AND SCREEN
ABO/RH(D): O POS
Antibody Screen: NEGATIVE
Unit division: 0

## 2019-08-10 LAB — BPAM RBC
Blood Product Expiration Date: 202108172359
ISSUE DATE / TIME: 202107221046
Unit Type and Rh: 5100

## 2019-08-10 LAB — GLUCOSE, CAPILLARY
Glucose-Capillary: 116 mg/dL — ABNORMAL HIGH (ref 70–99)
Glucose-Capillary: 121 mg/dL — ABNORMAL HIGH (ref 70–99)
Glucose-Capillary: 126 mg/dL — ABNORMAL HIGH (ref 70–99)

## 2019-08-10 MED ORDER — KCL IN DEXTROSE-NACL 40-5-0.45 MEQ/L-%-% IV SOLN
INTRAVENOUS | Status: DC
  Filled 2019-08-10 (×9): qty 1000

## 2019-08-10 MED ORDER — CEFAZOLIN SODIUM-DEXTROSE 1-4 GM/50ML-% IV SOLN
1.0000 g | Freq: Three times a day (TID) | INTRAVENOUS | Status: DC
  Administered 2019-08-10 – 2019-08-13 (×9): 1 g via INTRAVENOUS
  Filled 2019-08-10 (×11): qty 50

## 2019-08-10 MED ORDER — KCL IN DEXTROSE-NACL 40-5-0.9 MEQ/L-%-% IV SOLN: INTRAVENOUS | Status: DC

## 2019-08-10 NOTE — Plan of Care (Signed)
  Problem: Activity: Goal: Risk for activity intolerance will decrease Outcome: Progressing   Problem: Pain Managment: Goal: General experience of comfort will improve Outcome: Progressing   Problem: Safety: Goal: Ability to remain free from injury will improve Outcome: Progressing   Problem: Nutrition: Goal: Adequate nutrition will be maintained Outcome: Not Progressing  Patient remains NPO with NGT to low suction   Problem: Education: Goal: Knowledge of General Education information will improve Description: Including pain rating scale, medication(s)/side effects and non-pharmacologic comfort measures Outcome: Completed/Met

## 2019-08-10 NOTE — Progress Notes (Signed)
[facility] Office:  [phone] General Surgery Progress Note   LOS: 9 days  POD -     Assessment and Plan: 1.  Partial SBO  Questionable mass/inflammation at ileocecal valve  I have spoken to her and her son about laparoscopic exploration to evaluate possible ileocecal mass - but she has not wanted surgery so far.  Plan:  Clamp NGT, start clear liquids, and see how she deos.  2.  UTI - Klebsiella Pnuemonia  WBC - 18,200 - 08/10/2019  On Rocephin 3.  Malnutrition  Prealbumin - 7.7 - 08/10/2019  Will need to consider TPN if she continues to no tolerate diet 4.  Anemia  Hgb - 8.6 - 08/10/2019 (post transfusion) 5.  CKD, stage III  Creatinine - 0.6 - 08/10/2019 6.  HTN 7.  Hiatal hernia 8.  Severe scoliosis 9.  DVT prophylaxis - On no chemoprohylaxis   Principal Problem:   Partial small bowel obstruction (HCC) Active Problems:   Essential hypertension   SBO (small bowel obstruction) (HCC)  Subjective:  Feels better, passing flatus, but no BM.  We discussed a plan of clamping her NGT.  She needs to ambulate.  I spoke to her son Desirai Traxler on the phone again today.  We went over a short term plan of clamping the NGT and feeding her and seeing how she does.  The question will be is what to do if she fails this.  Her grandson has still not visited her or contacted her.  Objective:   Vitals:   08/10/19 0044 08/10/19 0402  BP: (!) 151/82 (!) 157/82  Pulse: (!) 106 101  Resp: 19 20  Temp: 98.6 F (37 C) 98.7 F (37.1 C)  SpO2: 97% 96%     Intake/Output from previous day:  07/22 0701 - 07/23 0700 In: 1570.1 [P.O.:100; I.V.:804.7; Blood:352; NG/GT:30; IV Piggyback:283.4] Out: 2831 [Urine:1100; Emesis/NG output:250]  Intake/Output this shift:  No intake/output data recorded.   Physical Exam:   General: Older WF  who is alert and oriented.    HEENT: Normal. Pupils equal.  Has NGT .   Lungs: Clear   Abdomen: Soft.  Has BS.  No localized tenderness.   Lab  Results:    Recent Labs    08/09/19 0509 08/10/19 0434  WBC 26.6* 18.2*  HGB 7.0* 8.6*  HCT 23.7* 27.8*  PLT 312 295    BMET   Recent Labs    08/09/19 0509 08/10/19 0434  NA 141 139  K 3.4* 3.2*  CL 106 108  CO2 23 22  GLUCOSE 120* 133*  BUN 34* 17  CREATININE 1.13* 0.60  CALCIUM 7.8* 7.7*    PT/INR  No results for input(s): LABPROT, INR in the last 72 hours.  ABG   Recent Labs    08/09/19 1210  PHART 7.480*  HCO3 25.4     Studies/Results:  DG Abd 2 Views  Result Date: 08/09/2019 CLINICAL DATA:  Possible intraperitoneal free air. EXAM: ABDOMEN - 2 VIEW COMPARISON:  08/08/2019 FINDINGS: Supine abdomen shows mild gaseous distention of small bowel in the central abdomen with loops measuring up to 4 cm diameter. Right-side-up decubitus film shows no intraperitoneal free air. Air-fluid levels are seen in the distended central small bowel loops. Bones are diffusely demineralized with convex leftward upper lumbar scoliosis. Patient is status post right hip replacement. IMPRESSION: 1. No evidence for intraperitoneal free air on the right-side-up decubitus film. 2. Mild gaseous distention of small bowel loops with air-fluid levels, minimally  decreased since 08/08/2019. Ileus or obstruction not excluded. Electronically Signed   By: Misty Stanley M.D.   On: 08/09/2019 11:58   DG Abd Portable 1V  Result Date: 08/08/2019 CLINICAL DATA:  Nasogastric tube placement. EXAM: PORTABLE ABDOMEN - 1 VIEW COMPARISON:  08/03/2019 FINDINGS: A gastric tube has been placed, tip overlying the level of the stomach. Patient is rotated. There is significant dilatation of small bowel loops. No evidence for free intraperitoneal air. Large hiatal hernia noted. Scoliosis and degenerative changes. IMPRESSION: 1. Interval placement of gastric tube. 2. Small bowel obstruction. Electronically Signed   By: Nolon Nations M.D.   On: 08/08/2019 15:03     Anti-infectives:   Anti-infectives (From admission,  onward)   Start     Dose/Rate Route Frequency Ordered Stop   08/07/19 1800  cefTRIAXone (ROCEPHIN) 1 g in sodium chloride 0.9 % 100 mL IVPB     Discontinue     1 g 200 mL/hr over 30 Minutes Intravenous Every 24 hours 08/07/19 1713        Alphonsa Overall, MD, [facility] Surgery Office: [phone] 08/10/2019

## 2019-08-10 NOTE — Progress Notes (Signed)
PROGRESS NOTE  Amber Hunter  KGM:[id number] DOB: July 23, 1928 DOA: 08/01/2019 PCP: Delrae Alfred, DO   Brief Narrative: Amber Hunter is an 86 y.o. female with a history of stage IIIa CKD, breast CA in remission, RLS, chronic pain who presented to [facility] 7/14 with nausea, vomiting, abdominal pain found to have imaging evidence of partial small bowel obstruction. Shew was admitted to the hospitalist service and treated conservatively per general surgery recommendations including NG tube decompression, IV fluids, and bowel rest. After passing clamping trial and seeing resolution of leukocytosis, NG tube was pulled and diet slowly advanced. On 7/20, the patient developed significant sinus tachycardia and abdominal pain with rising WBC. Ceftriaxone was started for nitrite-positive pyuria and bacteriuria. CT abd/pelvis was repeated showing persistent small bowel obstruction with transition point at a point of irregular thickening at the cecum. General surgery was reconsulted and recommended surgical evaluation. The patient has declined, so we are restarting conservative measures with NG tube, beginning clamping trial 7/23.  Assessment & Plan: Principal Problem:   Partial small bowel obstruction (HCC) Active Problems:   Essential hypertension   SBO (small bowel obstruction) (HCC)  SBO: With transition point near cecum which is irregularly thickened (?mass vs. inflammation). Previously suspected to be due to adhesions s/p appendectomy and hysterectomy. - Appreciate surgery's recommendations and discussions.  - Clamp NGT, offer clears if tolerates clamping. Support with antiemetics prn.   Sepsis due to Klebsiella UTI: Leukocytosis and sinus tachycardia satisfy SIRS. Also note bilateral perinephric stranding and bladder wall thickening on CT. - Change CTX > ancef per susceptibility data. - Blood cultures drawn after antibiotics are NGTD.  Severe protein calorie malnutrition: Prealbumin is 7 and taking  very little-to-nothing by mouth.  - If unable to advance diet today, will insert PICC and start TPN. - Continue dextrose-containing IVF for now.  Anemia of acute illness on anemia of chronic disease: No bleeding noted. Hgb 7 this AM and likely going to surgery. With tachycardia, suspect this represents symptomatic anemia.  - Hgb 7 > 8.6 s/p 1u PRBCs 7/22.  HTN:  - Holding lisinopril, HCTZ for now, BP not severely elevated.  AKI on stage IIIa CKD: Improved with IVF. Note prerenal FENa. No hydronephrosis on CT. - Monitor, continue IVF with maintenance D5 1/2NS w/50mEq/L KCl @ 100cc/hr given increased losses.   Hypokalemia due to GI losses:  - Add to IVF. Supplement concentration in IVF as above.  Hyperglycemia due to prediabetes: Technically meets definition with HbA1c 5.8%.  - Optimize glycemic control.   Breast CA: In remission  RLS:  - Holding mirapex while NPO.  Lactic acidosis: POA, resolved.   Depression:  - Hold citalopram while NPO.   LFT elevation: Likely due to sepsis. Will recheck intermittently. Liver appeared normal on CT  DVT prophylaxis: Heparin Code Status: Full confirmed with patient Family Communication: None at bedside.  Disposition Plan:  Status is: Inpatient  Remains inpatient appropriate because:Hemodynamically unstable, Ongoing active pain requiring inpatient pain management, IV treatments appropriate due to intensity of illness or inability to take PO and Inpatient level of care appropriate due to severity of illness   Dispo: The patient is from: Home              Anticipated d/c is to: Home              Anticipated d/c date is: 3 days              Patient currently is not medically stable to d/c.  Consultants:   General surgery  Procedures:   None  Antimicrobials:  Ceftriaxone 7/20 -7/23  Ancef 7/23 >>  Subjective: Minimal nausea with clamped NGT, wants to continue clears and avoid surgery. No abdominal pain or other complaints  currently. Passing flatus.   Objective: Vitals:   08/10/19 0044 08/10/19 0402 08/10/19 0731 08/10/19 1302  BP: (!) 151/82 (!) 157/82  (!) 136/88  Pulse: (!) 106 101  (!) 108  Resp: 19 20  20   Temp: 98.6 F (37 C) 98.7 F (37.1 C)  97.7 F (36.5 C)  TempSrc:  Oral  Oral  SpO2: 97% 96%  98%  Weight:   64.5 kg   Height:        Intake/Output Summary (Last 24 hours) at 08/10/2019 1406 Last data filed at 08/10/2019 0400 Gross per 24 hour  Intake 534.53 ml  Output 1350 ml  Net -815.47 ml   Filed Weights   08/01/19 1405 08/10/19 0731  Weight: 64.4 kg 64.5 kg   Gen: Elderly, alert female in no distress Pulm: Nonlabored breathing room air. Clear. CV: Regular rate and rhythm. No murmur, rub, or gallop. No JVD, no dependent edema. GI: Abdomen soft, minimally tender and distended, hypoactive bowel sounds.   Ext: Warm, no deformities Skin: No rashes, lesions or ulcers on visualized skin. Neuro: Alert and oriented. No focal neurological deficits. Psych: Judgement and insight appear fair. Mood euthymic & affect congruent. Behavior is appropriate.    Data Reviewed: I have personally reviewed following labs and imaging studies  CBC: Recent Labs  Lab 08/07/19 0454 08/07/19 1519 08/08/19 0433 08/09/19 0509 08/10/19 0434  WBC 13.0* 17.3* 27.4* 26.6* 18.2*  NEUTROABS  --   --   --  22.4* 14.7*  HGB 8.2* 9.1* 8.3* 7.0* 8.6*  HCT 27.3* 30.0* 27.6* 23.7* 27.8*  MCV 83.7 83.3 84.1 85.9 85.3  PLT 331 437* 377 312 716   Basic Metabolic Panel: Recent Labs  Lab 08/07/19 0454 08/07/19 1519 08/08/19 0433 08/09/19 0509 08/10/19 0434  NA 138 139 139 141 139  K 3.5 4.0 3.7 3.4* 3.2*  CL 104 100 105 106 108  CO2 26 27 24 23 22   GLUCOSE 116* 174* 151* 120* 133*  BUN 16 27* 28* 34* 17  CREATININE 0.75 1.01* 0.80 1.13* 0.60  CALCIUM 8.9 9.4 8.6* 7.8* 7.7*   GFR: Estimated Creatinine Clearance: 43 mL/min (by C-G formula based on SCr of 0.6 mg/dL). Liver Function Tests: Recent Labs    Lab 08/07/19 1519 08/08/19 0433 08/09/19 0509  AST 35 58* 57*  ALT 24 45* 67*  ALKPHOS 58 55 51  BILITOT 0.3 0.5 0.5  PROT 6.8 6.0* 5.5*  ALBUMIN 3.3* 3.0* 2.6*   No results for input(s): LIPASE, AMYLASE in the last 168 hours. No results for input(s): AMMONIA in the last 168 hours. Coagulation Profile: No results for input(s): INR, PROTIME in the last 168 hours. Cardiac Enzymes: No results for input(s): CKTOTAL, CKMB, CKMBINDEX, TROPONINI in the last 168 hours. BNP (last 3 results) No results for input(s): PROBNP in the last 8760 hours. HbA1C: No results for input(s): HGBA1C in the last 72 hours. CBG: Recent Labs  Lab 08/09/19 0744 08/09/19 1136 08/09/19 1559 08/10/19 0002 08/10/19 0802  GLUCAP 101* 110* 106* 116* 121*   Lipid Profile: No results for input(s): CHOL, HDL, LDLCALC, TRIG, CHOLHDL, LDLDIRECT in the last 72 hours. Thyroid Function Tests: No results for input(s): TSH, T4TOTAL, FREET4, T3FREE, THYROIDAB in the last 72 hours. Anemia Panel: No results  for input(s): VITAMINB12, FOLATE, FERRITIN, TIBC, IRON, RETICCTPCT in the last 72 hours. Urine analysis:    Component Value Date/Time   COLORURINE YELLOW 08/07/2019 1600   APPEARANCEUR TURBID (A) 08/07/2019 1600   APPEARANCEUR Hazy 09/02/2013 0055   LABSPEC 1.017 08/07/2019 1600   LABSPEC 1.010 09/02/2013 0055   PHURINE 5.0 08/07/2019 1600   GLUCOSEU NEGATIVE 08/07/2019 1600   GLUCOSEU 50 mg/dL 09/02/2013 0055   HGBUR MODERATE (A) 08/07/2019 1600   BILIRUBINUR NEGATIVE 08/07/2019 1600   BILIRUBINUR Negative 09/02/2013 0055   KETONESUR NEGATIVE 08/07/2019 1600   PROTEINUR 30 (A) 08/07/2019 1600   NITRITE POSITIVE (A) 08/07/2019 1600   LEUKOCYTESUR MODERATE (A) 08/07/2019 1600   LEUKOCYTESUR Trace 09/02/2013 0055   Recent Results (from the past 240 hour(s))  SARS Coronavirus 2 by RT PCR (hospital order, performed in [place] hospital lab) Nasopharyngeal Nasopharyngeal Swab     Status: None    Collection Time: 08/01/19  2:31 PM   Specimen: Nasopharyngeal Swab  Result Value Ref Range Status   SARS Coronavirus 2 NEGATIVE NEGATIVE Final    Comment: (NOTE) SARS-CoV-2 target nucleic acids are NOT DETECTED.  The SARS-CoV-2 RNA is generally detectable in upper and lower respiratory specimens during the acute phase of infection. The lowest concentration of SARS-CoV-2 viral copies this assay can detect is 250 copies / mL. A negative result does not preclude SARS-CoV-2 infection and should not be used as the sole basis for treatment or other patient management decisions.  A negative result may occur with improper specimen collection / handling, submission of specimen other than nasopharyngeal swab, presence of viral mutation(s) within the areas targeted by this assay, and inadequate number of viral copies (<250 copies / mL). A negative result must be combined with clinical observations, patient history, and epidemiological information.  Fact Sheet for Patients:   StrictlyIdeas.no  Fact Sheet for Healthcare Providers: BankingDealers.co.za  This test is not yet approved or  cleared by the [place] FDA and has been authorized for detection and/or diagnosis of SARS-CoV-2 by FDA under an Emergency Use Authorization (EUA).  This EUA will remain in effect (meaning this test can be used) for the duration of the COVID-19 declaration under Section 564(b)(1) of the Act, 21 U.S.C. section 360bbb-3(b)(1), unless the authorization is terminated or revoked sooner.  Performed at [facility], [address] [place], [place] [address]   Culture, Urine     Status: Abnormal   Collection Time: 08/07/19  4:00 PM   Specimen: Urine, Random  Result Value Ref Range Status   Specimen Description   Final    URINE, RANDOM Performed at [facility], [place] [address], [place], [place] [address]    Special Requests   Final      add on to earlier sample Performed at [facility], [place] [address] [place], [place] [address]    Culture >=100,000 COLONIES/mL KLEBSIELLA PNEUMONIAE (A)  Final   Report Status 08/09/2019 FINAL  Final   Organism ID, Bacteria KLEBSIELLA PNEUMONIAE (A)  Final      Susceptibility   Klebsiella pneumoniae - MIC*    AMPICILLIN RESISTANT Resistant     CEFAZOLIN <=4 SENSITIVE Sensitive     CEFTRIAXONE <=0.25 SENSITIVE Sensitive     CIPROFLOXACIN <=0.25 SENSITIVE Sensitive     GENTAMICIN <=1 SENSITIVE Sensitive     IMIPENEM <=0.25 SENSITIVE Sensitive     NITROFURANTOIN 32 SENSITIVE Sensitive     TRIMETH/SULFA <=20 SENSITIVE Sensitive  AMPICILLIN/SULBACTAM 4 SENSITIVE Sensitive     PIP/TAZO <=4 SENSITIVE Sensitive     * >=100,000 COLONIES/mL KLEBSIELLA PNEUMONIAE  Culture, blood (routine x 2)     Status: None (Preliminary result)   Collection Time: 08/09/19  8:50 AM   Specimen: BLOOD  Result Value Ref Range Status   Specimen Description   Final    BLOOD RIGHT ARM Performed at [facility] [address], [place], [place] [address]    Special Requests   Final    BOTTLES DRAWN AEROBIC ONLY Blood Culture results may not be optimal due to an inadequate volume of blood received in culture bottles Performed at [facility] [address], [place], [place] [address]    Culture   Final    NO GROWTH < 24 HOURS Performed at [facility] [address] [place], [place] [address]    Report Status PENDING  Incomplete  Culture, blood (routine x 2)     Status: None (Preliminary result)   Collection Time: 08/09/19  4:16 PM   Specimen: BLOOD RIGHT HAND  Result Value Ref Range Status   Specimen Description   Final    BLOOD RIGHT HAND Performed at [facility] [address], [place], [place] [address]    Special Requests   Final    BOTTLES DRAWN AEROBIC ONLY Blood Culture results may not be optimal due to  an inadequate volume of blood received in culture bottles Performed at [facility] [address], [place], [place] [address]    Culture   Final    NO GROWTH < 12 HOURS Performed at [facility] [address] [place], [place] [address]    Report Status PENDING  Incomplete      Radiology Studies: DG Abd 2 Views  Result Date: 08/09/2019 CLINICAL DATA:  Possible intraperitoneal free air. EXAM: ABDOMEN - 2 VIEW COMPARISON:  08/08/2019 FINDINGS: Supine abdomen shows mild gaseous distention of small bowel in the central abdomen with loops measuring up to 4 cm diameter. Right-side-up decubitus film shows no intraperitoneal free air. Air-fluid levels are seen in the distended central small bowel loops. Bones are diffusely demineralized with convex leftward upper lumbar scoliosis. Patient is status post right hip replacement. IMPRESSION: 1. No evidence for intraperitoneal free air on the right-side-up decubitus film. 2. Mild gaseous distention of small bowel loops with air-fluid levels, minimally decreased since 08/08/2019. Ileus or obstruction not excluded. Electronically Signed   By: Misty Stanley M.D.   On: 08/09/2019 11:58    Scheduled Meds: . heparin injection (subcutaneous)  5,000 Units Subcutaneous Q8H  . pneumococcal 23 valent vaccine  0.5 mL Intramuscular Tomorrow-1000  . sodium chloride flush  3 mL Intravenous Once   Continuous Infusions: .  ceFAZolin (ANCEF) IV    . dextrose 5 % and 0.45 % NaCl with KCl 40 mEq/L 100 mL/hr at 08/10/19 0915     LOS: 9 days   Time spent: 25 minutes.  Patrecia Pour, MD [facility] [email or web address] 08/10/2019, 2:06 PM

## 2019-08-10 NOTE — Progress Notes (Signed)
Physical Therapy Treatment Patient Details Name: Amber Hunter MRN: [id number] DOB: 01-01-1929 Today's Date: 08/10/2019    History of Present Illness Pt is 86 y.o. female with history of hypertension, chronic kidney disease stage III, breast cancer in remission, restless leg syndrome, chronic pain on tramadol presents to the ER with complaints of nausea vomiting abdominal pain for last 3 days.  Pt admitted with SBO likely from adhesive disease.  Pt is currently with NG tube, no surgical intervention at this time.    PT Comments    Pt in bed with NG clamped.  Assisted OOB to [facility] required much effort.  Pt VERY WEAK.  HR increased to 147.  Required an extended rest break while seated on BSC.  Assisted with hygiene.  Assisted with amb.  General Gait Details: very limited activity tolerance with max c/o weakness and effort needed.  HR increased from 112 at rest to 153 briefly.  Amb twice 9 feet at a time with recliner following. Progressively weak due to extended length of stay and lack of po intake.  May need SNF for ST Rehab.    Follow Up Recommendations  Home health PT;Supervision for mobility/OOB     Equipment Recommendations  Rolling walker with 5" wheels    Recommendations for Other Services       Precautions / Restrictions Precautions Precautions: Fall Precaution Comments: NG clamped    Mobility  Bed Mobility Overal bed mobility: Needs Assistance Bed Mobility: Supine to Sit     Supine to sit: Mod assist;Max assist     General bed mobility comments: heavy reliance on rail, incr time plus use of bed pad to complete scooting to EOB.  Once upright, pt was able to static sit EOB with Supervision but present with Kyphotic forward flex posture (head down)  Transfers Overall transfer level: Needs assistance Equipment used: Rolling walker (2 wheeled) Transfers: Sit to/from [facility] Sit to Stand: Min assist Stand pivot transfers: Min assist;Mod assist        General transfer comment: assisted from elevated bed to [facility] with 25% VC's on proper hand placement and increased time to complete.  VERY WEAK. HR increased to 147.  Ambulation/Gait Ambulation/Gait assistance: Min assist Gait Distance (Feet): 18 Feet Assistive device: Rolling walker (2 wheeled) Gait Pattern/deviations: Decreased stride length;Trunk flexed Gait velocity: decreased   General Gait Details: very limited activity tolerance with max c/o weakness and effort needed.  HR increased from 112 at rest to 153 briefly.  Amb twice 9 feet at a time with recliner following.   Stairs             Wheelchair Mobility    Modified Rankin (Stroke Patients Only)       Balance                                            Cognition Arousal/Alertness: Awake/alert Behavior During Therapy: WFL for tasks assessed/performed Overall Cognitive Status: Within Functional Limits for tasks assessed                                 General Comments: AxO x 3 very sweet      Exercises      General Comments        Pertinent Vitals/Pain Pain Assessment: Faces Faces Pain Scale: Hurts a little  bit Pain Location: stomach Pain Descriptors / Indicators: Discomfort;Grimacing    Home Living                      Prior Function            PT Goals (current goals can now be found in the care plan section) Progress towards PT goals: Progressing toward goals    Frequency    Min 3X/week      PT Plan Current plan remains appropriate    Co-evaluation              AM-PAC PT "6 Clicks" Mobility   Outcome Measure  Help needed turning from your back to your side while in a flat bed without using bedrails?: A Lot Help needed moving from lying on your back to sitting on the side of a flat bed without using bedrails?: A Lot Help needed moving to and from a bed to a chair (including a wheelchair)?: A Lot Help needed standing up from a chair  using your arms (e.g., wheelchair or bedside chair)?: A Lot Help needed to walk in hospital room?: A Lot Help needed climbing 3-5 steps with a railing? : Total 6 Click Score: 11    End of Session Equipment Utilized During Treatment: Gait belt Activity Tolerance: Treatment limited secondary to medical complications (Comment) Patient left: in chair;with call bell/phone within reach Nurse Communication: Mobility status PT Visit Diagnosis: Unsteadiness on feet (R26.81);Muscle weakness (generalized) (M62.81)     Time: 9326-7124 PT Time Calculation (min) (ACUTE ONLY): 25 min  Charges:  $Gait Training: 8-22 mins $Therapeutic Activity: 8-22 mins                     Rica Koyanagi  PTA Acute  Rehabilitation Services Pager      [phone] Office      [phone]

## 2019-08-10 NOTE — Progress Notes (Signed)
When getting patient up to ambulate this afternoon, small amount of brown stool, mucus consistency noted on pad.  Patient states she is still passing gas as well.  Patient has tolerated sips of ginger ale, juice, and a few bites of jello without complaints of pain or nausea.

## 2019-08-11 LAB — CBC
HCT: 27.2 % — ABNORMAL LOW (ref 36.0–46.0)
Hemoglobin: 8.3 g/dL — ABNORMAL LOW (ref 12.0–15.0)
MCH: 26.7 pg (ref 26.0–34.0)
MCHC: 30.5 g/dL (ref 30.0–36.0)
MCV: 87.5 fL (ref 80.0–100.0)
Platelets: 287 10*3/uL (ref 150–400)
RBC: 3.11 MIL/uL — ABNORMAL LOW (ref 3.87–5.11)
RDW: 16.3 % — ABNORMAL HIGH (ref 11.5–15.5)
WBC: 13.9 10*3/uL — ABNORMAL HIGH (ref 4.0–10.5)
nRBC: 0 % (ref 0.0–0.2)

## 2019-08-11 LAB — GLUCOSE, CAPILLARY
Glucose-Capillary: 107 mg/dL — ABNORMAL HIGH (ref 70–99)
Glucose-Capillary: 117 mg/dL — ABNORMAL HIGH (ref 70–99)
Glucose-Capillary: 120 mg/dL — ABNORMAL HIGH (ref 70–99)
Glucose-Capillary: 123 mg/dL — ABNORMAL HIGH (ref 70–99)

## 2019-08-11 LAB — COMPREHENSIVE METABOLIC PANEL
ALT: 33 U/L (ref 0–44)
AST: 19 U/L (ref 15–41)
Albumin: 2.5 g/dL — ABNORMAL LOW (ref 3.5–5.0)
Alkaline Phosphatase: 46 U/L (ref 38–126)
Anion gap: 9 (ref 5–15)
BUN: 10 mg/dL (ref 8–23)
CO2: 22 mmol/L (ref 22–32)
Calcium: 8.1 mg/dL — ABNORMAL LOW (ref 8.9–10.3)
Chloride: 107 mmol/L (ref 98–111)
Creatinine, Ser: 0.64 mg/dL (ref 0.44–1.00)
GFR calc Af Amer: >60 mL/min (ref >60)
GFR calc non Af Amer: >60 mL/min (ref >60)
Glucose, Bld: 124 mg/dL — ABNORMAL HIGH (ref 70–99)
Potassium: 3.6 mmol/L (ref 3.5–5.1)
Sodium: 138 mmol/L (ref 135–145)
Total Bilirubin: 0.3 mg/dL (ref 0.3–1.2)
Total Protein: 5.1 g/dL — ABNORMAL LOW (ref 6.5–8.1)

## 2019-08-11 MED ORDER — PRAMIPEXOLE DIHYDROCHLORIDE 0.125 MG PO TABS
0.1250 mg | ORAL_TABLET | Freq: Every day | ORAL | Status: DC
  Administered 2019-08-11 – 2019-08-12 (×2): 0.125 mg via ORAL
  Filled 2019-08-11 (×3): qty 1

## 2019-08-11 MED ORDER — TRAZODONE HCL 50 MG PO TABS
50.0000 mg | ORAL_TABLET | Freq: Every day | ORAL | Status: DC
  Administered 2019-08-11 – 2019-08-12 (×2): 50 mg via ORAL
  Filled 2019-08-11 (×2): qty 1

## 2019-08-11 MED ORDER — LISINOPRIL 10 MG PO TABS
10.0000 mg | ORAL_TABLET | Freq: Every day | ORAL | Status: DC
  Administered 2019-08-11 – 2019-08-13 (×3): 10 mg via ORAL
  Filled 2019-08-11 (×3): qty 1

## 2019-08-11 MED ORDER — HYDROCHLOROTHIAZIDE 12.5 MG PO CAPS
12.5000 mg | ORAL_CAPSULE | Freq: Every day | ORAL | Status: DC
  Administered 2019-08-11 – 2019-08-13 (×3): 12.5 mg via ORAL
  Filled 2019-08-11 (×3): qty 1

## 2019-08-11 MED ORDER — LISINOPRIL-HYDROCHLOROTHIAZIDE 10-12.5 MG PO TABS: 1.0000 | ORAL_TABLET | Freq: Every day | ORAL | Status: DC

## 2019-08-11 NOTE — Progress Notes (Signed)
Pt returned to bed, tolerating clear liquids, continue to have loose brown BM abd soft. Will cont to monitor. SRP, RN

## 2019-08-11 NOTE — Progress Notes (Signed)
NGT discontinued pt tolerated well, BM noted. Pt at bedside preparing to ambulate pt and get up in chair. SRP, RN

## 2019-08-11 NOTE — Progress Notes (Signed)
CMT called at 1620 to report pt had 6 beats of SVT, pt stable, asymptomatic. MD made aware and no new orders needed at this time. SRP, RN

## 2019-08-11 NOTE — Plan of Care (Signed)
  Problem: Activity: Goal: Risk for activity intolerance will decrease Outcome: Progressing   

## 2019-08-11 NOTE — Progress Notes (Signed)
Physical Therapy Treatment Patient Details Name: Amber Hunter MRN: [id number] DOB: 1928/08/11 Today's Date: 08/11/2019    History of Present Illness Pt is 86 y.o. female with history of hypertension, chronic kidney disease stage III, breast cancer in remission, restless leg syndrome, chronic pain on tramadol presents to the ER with complaints of nausea vomiting abdominal pain for last 3 days.  Pt admitted with SBO likely from adhesive disease.  Pt is currently with NG tube, no surgical intervention at this time.    PT Comments    NG tube D/C.  "Does this mean I can go home?" pt is so sweet.  Assisted OOB to amb.  General bed mobility comments: increased time and use of bed rail, pts self able.  General transfer comment: assisted from elevated bed to [facility] with 25% VC's on proper hand placement and increased time to complete.  VERY WEAK but so willing to try so she can go home.General Gait Details: very limited activity tolerance with max c/o weakness and effort needed.  HR increased from 112 at rest to 143 briefly. Rcliner following for safety as pt needs to sit. Positioned in recliner to comfort.  Pt progressing slowly.  Hopefully she will get stronger as her PO intake increases.    Follow Up Recommendations  Home health PT;Supervision for mobility/OOB     Equipment Recommendations  Rolling walker with 5" wheels    Recommendations for Other Services       Precautions / Restrictions Precautions Precautions: Fall Precaution Comments: NG tube D/C still on clear liquids    Mobility  Bed Mobility Overal bed mobility: Needs Assistance Bed Mobility: Supine to Sit   Sidelying to sit: Modified independent (Device/Increase time);HOB elevated       General bed mobility comments: increased time and use of bed rail, pts self able  Transfers Overall transfer level: Needs assistance Equipment used: Rolling walker (2 wheeled) Transfers: Sit to/from [facility] Sit to Stand:  Min guard Stand pivot transfers: Min guard;Min assist       General transfer comment: assisted from elevated bed to [facility] with 25% VC's on proper hand placement and increased time to complete.  VERY WEAK but so willing to try so she can go home.  Ambulation/Gait Ambulation/Gait assistance: Min assist Gait Distance (Feet): 22 Feet Assistive device: Rolling walker (2 wheeled) Gait Pattern/deviations: Decreased stride length;Trunk flexed Gait velocity: decreased   General Gait Details: very limited activity tolerance with max c/o weakness and effort needed.  HR increased from 112 at rest to 143 briefly. Rcliner following for safety as pt needs to sit.   Stairs             Wheelchair Mobility    Modified Rankin (Stroke Patients Only)       Balance                                            Cognition   Behavior During Therapy: WFL for tasks assessed/performed Overall Cognitive Status: Within Functional Limits for tasks assessed                                 General Comments: AxO x 3 very sweet      Exercises      General Comments        Pertinent Vitals/Pain  Pain Assessment: No/denies pain    Home Living                      Prior Function            PT Goals (current goals can now be found in the care plan section) Progress towards PT goals: Progressing toward goals    Frequency    Min 3X/week      PT Plan Current plan remains appropriate    Co-evaluation              AM-PAC PT "6 Clicks" Mobility   Outcome Measure  Help needed turning from your back to your side while in a flat bed without using bedrails?: A Little Help needed moving from lying on your back to sitting on the side of a flat bed without using bedrails?: A Little Help needed moving to and from a bed to a chair (including a wheelchair)?: A Little Help needed standing up from a chair using your arms (e.g., wheelchair or bedside  chair)?: A Little Help needed to walk in hospital room?: A Little Help needed climbing 3-5 steps with a railing? : A Lot 6 Click Score: 17    End of Session Equipment Utilized During Treatment: Gait belt Activity Tolerance: Treatment limited secondary to medical complications (Comment) Patient left: in chair;with call bell/phone within reach Nurse Communication: Mobility status PT Visit Diagnosis: Unsteadiness on feet (R26.81);Muscle weakness (generalized) (M62.81)     Time: 3474-2595 PT Time Calculation (min) (ACUTE ONLY): 14 min  Charges:  $Gait Training: 8-22 mins                     Rica Koyanagi  PTA Acute  Rehabilitation Services Pager      [phone] Office      [phone]

## 2019-08-11 NOTE — Progress Notes (Signed)
[facility] Office:  [phone] General Surgery Progress Note   LOS: 10 days  POD -     Assessment and Plan: 1.  Partial SBO  Questionable mass/inflammation at ileocecal valve   she has not wanted surgery   Plan:  NGT clamped for 24 h with no further vomiting, tolerating clear liquids, will d/c NG and cont clears for now  2.  UTI - Klebsiella Pnuemonia  WBC - 18,200 - 08/10/2019  On Rocephin 3.  Malnutrition  Prealbumin - 7.7 - 08/10/2019  Will need to consider TPN if she continues to no tolerate diet 4.  Anemia  Hgb - 8.6 - 08/10/2019 (post transfusion) 5.  CKD, stage III  Creatinine - 0.6 - 08/10/2019 6.  HTN 7.  Hiatal hernia 8.  Severe scoliosis 9.  DVT prophylaxis - On no chemoprohylaxis   Principal Problem:   Partial small bowel obstruction (HCC) Active Problems:   Essential hypertension   SBO (small bowel obstruction) (HCC)  Subjective:  Feels better, passing flatus and had a BM yesterday  Objective:   Vitals:   08/10/19 1302 08/11/19 0616  BP: (!) 136/88 (!) 161/67  Pulse: (!) 108 77  Resp: 20 19  Temp: 97.7 F (36.5 C) 97.7 F (36.5 C)  SpO2: 98% 98%     Intake/Output from previous day:  07/23 0701 - 07/24 0700 In: 684.6 [P.O.:120; I.V.:564.6] Out: 2750 [Urine:2750]  Intake/Output this shift:  No intake/output data recorded.   Physical Exam:   General: Older WF  who is alert and oriented.    HEENT: Normal. Pupils equal.  Has NGT .   Lungs: Clear   Abdomen: Soft.  Has BS.  No localized tenderness.   Lab Results:    Recent Labs    08/10/19 0434 08/11/19 0525  WBC 18.2* 13.9*  HGB 8.6* 8.3*  HCT 27.8* 27.2*  PLT 295 287    BMET   Recent Labs    08/10/19 0434 08/11/19 0525  NA 139 138  K 3.2* 3.6  CL 108 107  CO2 22 22  GLUCOSE 133* 124*  BUN 17 10  CREATININE 0.60 0.64  CALCIUM 7.7* 8.1*    PT/INR  No results for input(s): LABPROT, INR in the last 72 hours.  ABG   Recent Labs    08/09/19 1210  PHART  7.480*  HCO3 25.4     Studies/Results:  DG Abd 2 Views  Result Date: 08/09/2019 CLINICAL DATA:  Possible intraperitoneal free air. EXAM: ABDOMEN - 2 VIEW COMPARISON:  08/08/2019 FINDINGS: Supine abdomen shows mild gaseous distention of small bowel in the central abdomen with loops measuring up to 4 cm diameter. Right-side-up decubitus film shows no intraperitoneal free air. Air-fluid levels are seen in the distended central small bowel loops. Bones are diffusely demineralized with convex leftward upper lumbar scoliosis. Patient is status post right hip replacement. IMPRESSION: 1. No evidence for intraperitoneal free air on the right-side-up decubitus film. 2. Mild gaseous distention of small bowel loops with air-fluid levels, minimally decreased since 08/08/2019. Ileus or obstruction not excluded. Electronically Signed   By: Misty Stanley M.D.   On: 08/09/2019 11:58     Anti-infectives:   Anti-infectives (From admission, onward)   Start     Dose/Rate Route Frequency Ordered Stop   08/10/19 1800  ceFAZolin (ANCEF) IVPB 1 g/50 mL premix     Discontinue     1 g 100 mL/hr over 30 Minutes Intravenous Every 8 hours 08/10/19 1030  08/07/19 1800  cefTRIAXone (ROCEPHIN) 1 g in sodium chloride 0.9 % 100 mL IVPB  Status:  Discontinued        1 g 200 mL/hr over 30 Minutes Intravenous Every 24 hours 08/07/19 1713 44/17/12 7871      Alric Geise C Reagen Haberman, MD  [facility]

## 2019-08-11 NOTE — Progress Notes (Signed)
Received verbal order to D/C clamped NGT. SRP, RN

## 2019-08-11 NOTE — Progress Notes (Addendum)
PROGRESS NOTE  Amber Hunter  ZOX:[id number] DOB: 02/19/1928 DOA: 08/01/2019 PCP: Amber Alfred, DO   Brief Narrative: Amber Hunter is an 86 y.o. female with a history of stage IIIa CKD, breast CA in remission, RLS, chronic pain who presented to [facility] 7/14 with nausea, vomiting, abdominal pain found to have imaging evidence of partial small bowel obstruction. Shew was admitted to the hospitalist service and treated conservatively per general surgery recommendations including NG tube decompression, IV fluids, and bowel rest. After passing clamping trial and seeing resolution of leukocytosis, NG tube was pulled and diet slowly advanced. On 7/20, the patient developed significant sinus tachycardia and abdominal pain with rising WBC. Ceftriaxone was started for nitrite-positive pyuria and bacteriuria. CT abd/pelvis was repeated showing persistent small bowel obstruction with transition point at a point of irregular thickening at the cecum. General surgery was reconsulted and recommended surgical evaluation. The patient has declined, so we are restarting conservative measures with NG tube, beginning clamping trial 7/23.  Assessment & Plan: Principal Problem:   Partial small bowel obstruction (HCC) Active Problems:   Essential hypertension   SBO (small bowel obstruction) (HCC)  SBO: With transition point near cecum which is irregularly thickened (?mass vs. inflammation). Previously suspected to be due to adhesions s/p appendectomy and hysterectomy. - Appreciate surgery's recommendations and discussions. DC NGT. Continue clears.   Sepsis due to Klebsiella UTI: Leukocytosis and sinus tachycardia satisfy SIRS. Also note bilateral perinephric stranding and bladder wall thickening on CT. - Continue ancef per susceptibility data. - Blood cultures drawn after antibiotics are NG2D.  Severe protein calorie malnutrition: Prealbumin is 7.  - Advancing toward full diet, so will hold off on PICC/TPN for right  now. - Continue dextrose-containing IVF for now.  Symptomatic anemia of acute illness on anemia of chronic disease: No bleeding noted.  - Hgb stable s/p 1u PRBCs 7/22.  HTN:  - Can restart home lisinopril, HCTZ with rising BP and taking po with resolution of AKI.  AKI on stage IIIa CKD: Improved with IVF. Note prerenal FENa. No hydronephrosis on CT. - Monitor, continue IVF with maintenance D5 1/2NS w/18mEq/L KCl, decrease to 75cc/hr (sub-maintenenance) with no further GI losses, taking diet slowly.  Hypokalemia due to GI losses:  - Added to IVF with improvement.    Hyperglycemia due to prediabetes: Technically meets definition with HbA1c 5.8%.  - Optimize glycemic control.   Breast CA: In remission  RLS:  - Can restart mirapex  Lactic acidosis: POA, resolved.   Depression:  - Can restart citalopram  LFT elevation: Resolved. was likely due to sepsis. Liver appeared normal on CT  DVT prophylaxis: Heparin Code Status: Full confirmed with patient Family Communication: None at bedside.  Disposition Plan:  Status is: Inpatient  Remains inpatient appropriate because:Hemodynamically unstable, Ongoing active pain requiring inpatient pain management, IV treatments appropriate due to intensity of illness or inability to take PO and Inpatient level of care appropriate due to severity of illness  Dispo: The patient is from: Home              Anticipated d/c is to: Home with home health PT, OT, RW.              Anticipated d/c date is: 1-2 days              Patient currently is not medically stable to d/c.  Consultants:   General surgery  Procedures:   None  Antimicrobials:  Ceftriaxone 7/20 -7/23  Ancef 7/23 >>  Subjective:  Reports no nausea, vomiting, abd pain, had small BM. Wants to go home. Still had NG tube in, though had been clamped for 1 day.   Objective: Vitals:   08/10/19 1302 08/11/19 0500 08/11/19 0616 08/11/19 1429  BP: (!) 136/88  (!) 161/67 (!) 159/93    Pulse: (!) 108  77 (!) 110  Resp: 20  19 15   Temp: 97.7 F (36.5 C)  97.7 F (36.5 C) 98.9 F (37.2 C)  TempSrc: Oral  Oral Oral  SpO2: 98%  98% 99%  Weight:  64.2 kg    Height:        Intake/Output Summary (Last 24 hours) at 08/11/2019 1433 Last data filed at 08/11/2019 0444 Gross per 24 hour  Intake 684.64 ml  Output 2750 ml  Net -2065.36 ml   Filed Weights   08/01/19 1405 08/10/19 0731 08/11/19 0500  Weight: 64.4 kg 64.5 kg 64.2 kg   Gen: Pleasant elderly female in no distress Pulm: Nonlabored breathing room air. Clear. CV: Regular rate and rhythm. No murmur, rub, or gallop. No JVD, no pitting dependent edema. GI: Abdomen soft, not tender, non-distended, with present bowel sounds.  Ext: Warm, no deformities Skin: No rashes, lesions or ulcers on visualized skin. Neuro: Alert and oriented. No focal neurological deficits. Psych: Judgement and insight appear fair. Mood euthymic & affect congruent. Behavior is appropriate.    Data Reviewed: I have personally reviewed following labs and imaging studies  CBC: Recent Labs  Lab 08/07/19 1519 08/08/19 0433 08/09/19 0509 08/10/19 0434 08/11/19 0525  WBC 17.3* 27.4* 26.6* 18.2* 13.9*  NEUTROABS  --   --  22.4* 14.7*  --   HGB 9.1* 8.3* 7.0* 8.6* 8.3*  HCT 30.0* 27.6* 23.7* 27.8* 27.2*  MCV 83.3 84.1 85.9 85.3 87.5  PLT 437* 377 312 295 387   Basic Metabolic Panel: Recent Labs  Lab 08/07/19 1519 08/08/19 0433 08/09/19 0509 08/10/19 0434 08/11/19 0525  NA 139 139 141 139 138  K 4.0 3.7 3.4* 3.2* 3.6  CL 100 105 106 108 107  CO2 27 24 23 22 22   GLUCOSE 174* 151* 120* 133* 124*  BUN 27* 28* 34* 17 10  CREATININE 1.01* 0.80 1.13* 0.60 0.64  CALCIUM 9.4 8.6* 7.8* 7.7* 8.1*   GFR: Estimated Creatinine Clearance: 43 mL/min (by C-G formula based on SCr of 0.64 mg/dL). Liver Function Tests: Recent Labs  Lab 08/07/19 1519 08/08/19 0433 08/09/19 0509 08/11/19 0525  AST 35 58* 57* 19  ALT 24 45* 67* 33  ALKPHOS  58 55 51 46  BILITOT 0.3 0.5 0.5 0.3  PROT 6.8 6.0* 5.5* 5.1*  ALBUMIN 3.3* 3.0* 2.6* 2.5*   No results for input(s): LIPASE, AMYLASE in the last 168 hours. No results for input(s): AMMONIA in the last 168 hours. Coagulation Profile: No results for input(s): INR, PROTIME in the last 168 hours. Cardiac Enzymes: No results for input(s): CKTOTAL, CKMB, CKMBINDEX, TROPONINI in the last 168 hours. BNP (last 3 results) No results for input(s): PROBNP in the last 8760 hours. HbA1C: No results for input(s): HGBA1C in the last 72 hours. CBG: Recent Labs  Lab 08/10/19 0002 08/10/19 0802 08/10/19 1634 08/11/19 0038 08/11/19 0812  GLUCAP 116* 121* 126* 107* 120*   Lipid Profile: No results for input(s): CHOL, HDL, LDLCALC, TRIG, CHOLHDL, LDLDIRECT in the last 72 hours. Thyroid Function Tests: No results for input(s): TSH, T4TOTAL, FREET4, T3FREE, THYROIDAB in the last 72 hours. Anemia Panel: No results for input(s): VITAMINB12, FOLATE, FERRITIN, TIBC,  IRON, RETICCTPCT in the last 72 hours. Urine analysis:    Component Value Date/Time   COLORURINE YELLOW 08/07/2019 1600   APPEARANCEUR TURBID (A) 08/07/2019 1600   APPEARANCEUR Hazy 09/02/2013 0055   LABSPEC 1.017 08/07/2019 1600   LABSPEC 1.010 09/02/2013 0055   PHURINE 5.0 08/07/2019 1600   GLUCOSEU NEGATIVE 08/07/2019 1600   GLUCOSEU 50 mg/dL 09/02/2013 0055   HGBUR MODERATE (A) 08/07/2019 1600   BILIRUBINUR NEGATIVE 08/07/2019 1600   BILIRUBINUR Negative 09/02/2013 0055   KETONESUR NEGATIVE 08/07/2019 1600   PROTEINUR 30 (A) 08/07/2019 1600   NITRITE POSITIVE (A) 08/07/2019 1600   LEUKOCYTESUR MODERATE (A) 08/07/2019 1600   LEUKOCYTESUR Trace 09/02/2013 0055   Recent Results (from the past 240 hour(s))  Culture, Urine     Status: Abnormal   Collection Time: 08/07/19  4:00 PM   Specimen: Urine, Random  Result Value Ref Range Status   Specimen Description   Final    URINE, RANDOM Performed at [facility], [place] [address], [place], [place] [address]    Special Requests   Final    add on to earlier sample Performed at [facility], [place] [address], [place], [place] [address]    Culture >=100,000 COLONIES/mL KLEBSIELLA PNEUMONIAE (A)  Final   Report Status 08/09/2019 FINAL  Final   Organism ID, Bacteria KLEBSIELLA PNEUMONIAE (A)  Final      Susceptibility   Klebsiella pneumoniae - MIC*    AMPICILLIN RESISTANT Resistant     CEFAZOLIN <=4 SENSITIVE Sensitive     CEFTRIAXONE <=0.25 SENSITIVE Sensitive     CIPROFLOXACIN <=0.25 SENSITIVE Sensitive     GENTAMICIN <=1 SENSITIVE Sensitive     IMIPENEM <=0.25 SENSITIVE Sensitive     NITROFURANTOIN 32 SENSITIVE Sensitive     TRIMETH/SULFA <=20 SENSITIVE Sensitive     AMPICILLIN/SULBACTAM 4 SENSITIVE Sensitive     PIP/TAZO <=4 SENSITIVE Sensitive     * >=100,000 COLONIES/mL KLEBSIELLA PNEUMONIAE  Culture, blood (routine x 2)     Status: None (Preliminary result)   Collection Time: 08/09/19  8:50 AM   Specimen: BLOOD  Result Value Ref Range Status   Specimen Description   Final    BLOOD RIGHT ARM Performed at [facility] [address] [place], [place] [address]    Special Requests   Final    BOTTLES DRAWN AEROBIC ONLY Blood Culture results may not be optimal due to an inadequate volume of blood received in culture bottles Performed at [facility] [address], [place], [place] [address]    Culture   Final    NO GROWTH 2 DAYS Performed at [facility] [address] [place], [place] [address]    Report Status PENDING  Incomplete  Culture, blood (routine x 2)     Status: None (Preliminary result)   Collection Time: 08/09/19  4:16 PM   Specimen: BLOOD RIGHT HAND  Result Value Ref Range Status   Specimen Description   Final    BLOOD RIGHT HAND Performed at [facility] [address] [place], [place] [address]    Special Requests   Final      BOTTLES DRAWN AEROBIC ONLY Blood Culture results may not be optimal due to an inadequate volume of blood received in culture bottles Performed at [facility] [address], [place], [place] [address]    Culture   Final    NO GROWTH 2 DAYS Performed at  [facility], [place] [address] [place], [place] [address]    Report Status PENDING  Incomplete      Radiology Studies: No results found.  Scheduled Meds: . heparin injection (subcutaneous)  5,000 Units Subcutaneous Q8H  . pneumococcal 23 valent vaccine  0.5 mL Intramuscular Tomorrow-1000  . sodium chloride flush  3 mL Intravenous Once   Continuous Infusions: .  ceFAZolin (ANCEF) IV 1 g (08/11/19 1038)  . dextrose 5 % and 0.45 % NaCl with KCl 40 mEq/L 100 mL/hr at 08/10/19 2114     LOS: 10 days   Time spent: 35 minutes.  Patrecia Pour, MD [facility] [email or web address] 08/11/2019, 2:33 PM

## 2019-08-11 NOTE — Progress Notes (Signed)
Assumed care of patient at 0400, in agreement with previous RN assessment.

## 2019-08-12 LAB — GLUCOSE, CAPILLARY
Glucose-Capillary: 94 mg/dL (ref 70–99)
Glucose-Capillary: 103 mg/dL — ABNORMAL HIGH (ref 70–99)

## 2019-08-12 LAB — CBC
HCT: 27.7 % — ABNORMAL LOW (ref 36.0–46.0)
Hemoglobin: 8.5 g/dL — ABNORMAL LOW (ref 12.0–15.0)
MCH: 26.5 pg (ref 26.0–34.0)
MCHC: 30.7 g/dL (ref 30.0–36.0)
MCV: 86.3 fL (ref 80.0–100.0)
Platelets: 318 10*3/uL (ref 150–400)
RBC: 3.21 MIL/uL — ABNORMAL LOW (ref 3.87–5.11)
RDW: 16.3 % — ABNORMAL HIGH (ref 11.5–15.5)
WBC: 10.6 10*3/uL — ABNORMAL HIGH (ref 4.0–10.5)
nRBC: 0 % (ref 0.0–0.2)

## 2019-08-12 NOTE — Progress Notes (Signed)
PROGRESS NOTE  Annalis Kaczmarczyk  ZOX:[id number] DOB: Jun 01, 1928 DOA: 08/01/2019 PCP: Delrae Alfred, DO   Brief Narrative: Nnenna Meador is an 86 y.o. female with a history of stage IIIa CKD, breast CA in remission, RLS, chronic pain who presented to [facility] 7/14 with nausea, vomiting, abdominal pain found to have imaging evidence of partial small bowel obstruction. Shew was admitted to the hospitalist service and treated conservatively per general surgery recommendations including NG tube decompression, IV fluids, and bowel rest. After passing clamping trial and seeing resolution of leukocytosis, NG tube was pulled and diet slowly advanced. On 7/20, the patient developed significant sinus tachycardia and abdominal pain with rising WBC. Ceftriaxone was started for nitrite-positive pyuria and bacteriuria. CT abd/pelvis was repeated showing persistent small bowel obstruction with transition point at a point of irregular thickening at the cecum. General surgery was reconsulted and recommended surgical evaluation. The patient has declined, so we are restarting conservative measures with NG tube, beginning clamping trial 7/23.  Assessment & Plan: Principal Problem:   Partial small bowel obstruction (HCC) Active Problems:   Essential hypertension   SBO (small bowel obstruction) (HCC)  SBO: With transition point near cecum which is irregularly thickened (?mass vs. inflammation). Previously suspected to be due to adhesions s/p appendectomy and hysterectomy. - Appreciate surgery's recommendations and discussions. DC NGT. Advancing to fulls today. Possible DC 7/26 if tolerating.  Sepsis due to Klebsiella UTI: Leukocytosis and sinus tachycardia satisfy SIRS. Also note bilateral perinephric stranding and bladder wall thickening on CT. Still with mild leukocytosis. - Continue ancef per susceptibility data. - Blood cultures drawn after antibiotics are NG3D.  Severe protein calorie malnutrition: Prealbumin is 7.  -  Advancing toward full diet, so will hold off on PICC/TPN for right now. - Continue dextrose-containing IVF for now.  Symptomatic anemia of acute illness on anemia of chronic disease: No bleeding noted.  - Hgb stable s/p 1u PRBCs 7/22.  HTN:  - Restarted home lisinopril, HCTZ with rising BP and taking po with resolution of AKI.  AKI on stage IIIa CKD: Improved with IVF. Note prerenal FENa. No hydronephrosis on CT. - Monitor, continue IVF with maintenance D5 1/2NS w/39mEq/L KCl, decrease to 75cc/hr (sub-maintenenance) with no further GI losses, taking diet slowly.  Hypokalemia due to GI losses:  - Added to IVF with improvement. Recheck in AM   Hyperglycemia due to prediabetes: Technically meets definition with HbA1c 5.8%.  - Optimize glycemic control.   Breast CA: In remission  RLS:  - Continue mirapex  Lactic acidosis: POA, resolved.   Depression:  - Continue citalopram  LFT elevation: Resolved. Was likely due to sepsis. Liver appeared normal on CT  DVT prophylaxis: Heparin Code Status: Full confirmed with patient Family Communication: None at bedside.  Disposition Plan:  Status is: Inpatient  Remains inpatient appropriate because:IV treatments appropriate due to intensity of illness or inability to take PO  Dispo: The patient is from: Home              Anticipated d/c is to: Home with home health PT, OT, RW.              Anticipated d/c date is: 1 day if tolerating full liquids              Patient currently is not medically stable to d/c.  Consultants:   General surgery  Procedures:   None  Antimicrobials:  Ceftriaxone 7/20 -7/23  Ancef 7/23 >>  Subjective: Abdominal pain improved dramatically, tolerating clears, hungry  to start fulls. Having flatus, BM. No nausea or vomiting.  Objective: Vitals:   08/11/19 0616 08/11/19 1429 08/11/19 2135 08/12/19 0425  BP: (!) 161/67 (!) 159/93 (!) 155/83 (!) 159/76  Pulse: 77 (!) 110 100 92  Resp: 19 15 20 18     Temp: 97.7 F (36.5 C) 98.9 F (37.2 C) 98 F (36.7 C) 98.4 F (36.9 C)  TempSrc: Oral Oral Oral Oral  SpO2: 98% 99% 98% 99%  Weight:      Height:        Intake/Output Summary (Last 24 hours) at 08/12/2019 1309 Last data filed at 08/12/2019 1219 Gross per 24 hour  Intake 480 ml  Output 1200 ml  Net -720 ml   Filed Weights   08/01/19 1405 08/10/19 0731 08/11/19 0500  Weight: 64.4 kg 64.5 kg 64.2 kg   Gen: 86 y.o. female in no distress Pulm: Nonlabored breathing room air. Clear. CV: Regular rate and rhythm. No murmur, rub, or gallop. No JVD, no dependent edema. GI: Abdomen soft, non-tender, non-distended, with normoactive bowel sounds.  Ext: Warm, no deformities Skin: No rashes, lesions or ulcers on visualized skin. Neuro: Alert and oriented. No focal neurological deficits. Psych: Judgement and insight appear fair. Mood euthymic & affect congruent. Behavior is appropriate.    Data Reviewed: I have personally reviewed following labs and imaging studies  CBC: Recent Labs  Lab 08/08/19 0433 08/09/19 0509 08/10/19 0434 08/11/19 0525 08/12/19 0501  WBC 27.4* 26.6* 18.2* 13.9* 10.6*  NEUTROABS  --  22.4* 14.7*  --   --   HGB 8.3* 7.0* 8.6* 8.3* 8.5*  HCT 27.6* 23.7* 27.8* 27.2* 27.7*  MCV 84.1 85.9 85.3 87.5 86.3  PLT 377 312 295 287 623   Basic Metabolic Panel: Recent Labs  Lab 08/07/19 1519 08/08/19 0433 08/09/19 0509 08/10/19 0434 08/11/19 0525  NA 139 139 141 139 138  K 4.0 3.7 3.4* 3.2* 3.6  CL 100 105 106 108 107  CO2 27 24 23 22 22   GLUCOSE 174* 151* 120* 133* 124*  BUN 27* 28* 34* 17 10  CREATININE 1.01* 0.80 1.13* 0.60 0.64  CALCIUM 9.4 8.6* 7.8* 7.7* 8.1*   GFR: Estimated Creatinine Clearance: 43 mL/min (by C-G formula based on SCr of 0.64 mg/dL). Liver Function Tests: Recent Labs  Lab 08/07/19 1519 08/08/19 0433 08/09/19 0509 08/11/19 0525  AST 35 58* 57* 19  ALT 24 45* 67* 33  ALKPHOS 58 55 51 46  BILITOT 0.3 0.5 0.5 0.3  PROT 6.8 6.0*  5.5* 5.1*  ALBUMIN 3.3* 3.0* 2.6* 2.5*   No results for input(s): LIPASE, AMYLASE in the last 168 hours. No results for input(s): AMMONIA in the last 168 hours. Coagulation Profile: No results for input(s): INR, PROTIME in the last 168 hours. Cardiac Enzymes: No results for input(s): CKTOTAL, CKMB, CKMBINDEX, TROPONINI in the last 168 hours. BNP (last 3 results) No results for input(s): PROBNP in the last 8760 hours. HbA1C: No results for input(s): HGBA1C in the last 72 hours. CBG: Recent Labs  Lab 08/11/19 0038 08/11/19 0812 08/11/19 1702 08/11/19 2339 08/12/19 0822  GLUCAP 107* 120* 123* 117* 94   Lipid Profile: No results for input(s): CHOL, HDL, LDLCALC, TRIG, CHOLHDL, LDLDIRECT in the last 72 hours. Thyroid Function Tests: No results for input(s): TSH, T4TOTAL, FREET4, T3FREE, THYROIDAB in the last 72 hours. Anemia Panel: No results for input(s): VITAMINB12, FOLATE, FERRITIN, TIBC, IRON, RETICCTPCT in the last 72 hours. Urine analysis:    Component Value Date/Time  COLORURINE YELLOW 08/07/2019 1600   APPEARANCEUR TURBID (A) 08/07/2019 1600   APPEARANCEUR Hazy 09/02/2013 0055   LABSPEC 1.017 08/07/2019 1600   LABSPEC 1.010 09/02/2013 0055   PHURINE 5.0 08/07/2019 1600   GLUCOSEU NEGATIVE 08/07/2019 1600   GLUCOSEU 50 mg/dL 09/02/2013 0055   HGBUR MODERATE (A) 08/07/2019 1600   BILIRUBINUR NEGATIVE 08/07/2019 1600   BILIRUBINUR Negative 09/02/2013 0055   KETONESUR NEGATIVE 08/07/2019 1600   PROTEINUR 30 (A) 08/07/2019 1600   NITRITE POSITIVE (A) 08/07/2019 1600   LEUKOCYTESUR MODERATE (A) 08/07/2019 1600   LEUKOCYTESUR Trace 09/02/2013 0055   Recent Results (from the past 240 hour(s))  Culture, Urine     Status: Abnormal   Collection Time: 08/07/19  4:00 PM   Specimen: Urine, Random  Result Value Ref Range Status   Specimen Description   Final    URINE, RANDOM Performed at [facility], [place] [address], [place], [place] [address]     Special Requests   Final    add on to earlier sample Performed at [facility], [place] [address], [place], [place] [address]    Culture >=100,000 COLONIES/mL KLEBSIELLA PNEUMONIAE (A)  Final   Report Status 08/09/2019 FINAL  Final   Organism ID, Bacteria KLEBSIELLA PNEUMONIAE (A)  Final      Susceptibility   Klebsiella pneumoniae - MIC*    AMPICILLIN RESISTANT Resistant     CEFAZOLIN <=4 SENSITIVE Sensitive     CEFTRIAXONE <=0.25 SENSITIVE Sensitive     CIPROFLOXACIN <=0.25 SENSITIVE Sensitive     GENTAMICIN <=1 SENSITIVE Sensitive     IMIPENEM <=0.25 SENSITIVE Sensitive     NITROFURANTOIN 32 SENSITIVE Sensitive     TRIMETH/SULFA <=20 SENSITIVE Sensitive     AMPICILLIN/SULBACTAM 4 SENSITIVE Sensitive     PIP/TAZO <=4 SENSITIVE Sensitive     * >=100,000 COLONIES/mL KLEBSIELLA PNEUMONIAE  Culture, blood (routine x 2)     Status: None (Preliminary result)   Collection Time: 08/09/19  8:50 AM   Specimen: BLOOD  Result Value Ref Range Status   Specimen Description   Final    BLOOD RIGHT ARM Performed at [facility] [address], [place], [place] [address]    Special Requests   Final    BOTTLES DRAWN AEROBIC ONLY Blood Culture results may not be optimal due to an inadequate volume of blood received in culture bottles Performed at [facility] [address], [place], [place] [address]    Culture   Final    NO GROWTH 3 DAYS Performed at [facility], [place] [address] [place], [place] [address]    Report Status PENDING  Incomplete  Culture, blood (routine x 2)     Status: None (Preliminary result)   Collection Time: 08/09/19  4:16 PM   Specimen: BLOOD RIGHT HAND  Result Value Ref Range Status   Specimen Description   Final    BLOOD RIGHT HAND Performed at [facility] [address], [place], [place] [address]    Special Requests   Final    BOTTLES DRAWN AEROBIC ONLY Blood Culture results may  not be optimal due to an inadequate volume of blood received in culture bottles Performed at [facility] [address], [place], [place] [address]    Culture   Final    NO GROWTH 3 DAYS Performed at [facility], [place] [address], [place],  [address]    Report Status PENDING  Incomplete      Radiology Studies: No results found.  Scheduled Meds: . heparin injection (subcutaneous)  5,000 Units Subcutaneous Q8H  . lisinopril  10 mg Oral Daily   And  . hydrochlorothiazide  12.5 mg Oral Daily  . pneumococcal 23 valent vaccine  0.5 mL Intramuscular Tomorrow-1000  . pramipexole  0.125 mg Oral QHS  . sodium chloride flush  3 mL Intravenous Once  . traZODone  50 mg Oral QHS   Continuous Infusions: .  ceFAZolin (ANCEF) IV 1 g (08/12/19 0930)  . dextrose 5 % and 0.45 % NaCl with KCl 40 mEq/L 75 mL/hr at 08/12/19 0053     LOS: 11 days   Time spent: 25 minutes.  Patrecia Pour, MD [facility] [email or web address] 08/12/2019, 1:09 PM

## 2019-08-12 NOTE — Progress Notes (Signed)
[facility]:  [phone] General Surgery Progress Note   LOS: 11 days  POD -     Assessment and Plan: 1.  Partial SBO  Questionable mass/inflammation at ileocecal valve   her and her family have not wanted surgery so far  Plan:   tolerating clear liquids, will advance to fulls.  Pt may need to stay on full liquids as she did not tolerate solid foods before  2.  UTI - Klebsiella Pnuemonia  WBC - 18,200 - 08/10/2019  On Rocephin 3.  Malnutrition  Prealbumin - 7.7 - 08/10/2019  Will need to consider TPN if she continues to no tolerate diet 4.  Anemia  Hgb - 8.6 - 08/10/2019 (post transfusion) 5.  CKD, stage III  Creatinine - 0.6 - 08/10/2019 6.  HTN 7.  Hiatal hernia 8.  Severe scoliosis 9.  DVT prophylaxis - On no chemoprohylaxis   Principal Problem:   Partial small bowel obstruction (HCC) Active Problems:   Essential hypertension   SBO (small bowel obstruction) (HCC)  Subjective:  Feels better, passing flatus and had a BM yesterday  Objective:   Vitals:   08/11/19 2135 08/12/19 0425  BP: (!) 155/83 (!) 159/76  Pulse: 100 92  Resp: 20 18  Temp: 98 F (36.7 C) 98.4 F (36.9 C)  SpO2: 98% 99%     Intake/Output from previous day:  07/24 0701 - 07/25 0700 In: -  Out: 1200 [Urine:1200]  Intake/Output this shift:  No intake/output data recorded.   Physical Exam:   General: Older WF  who is alert and oriented.    HEENT: Normal. Pupils equal.  .     Abdomen: Soft.  Non-distended  No localized tenderness.   Lab Results:    Recent Labs    08/11/19 0525 08/12/19 0501  WBC 13.9* 10.6*  HGB 8.3* 8.5*  HCT 27.2* 27.7*  PLT 287 318    BMET   Recent Labs    08/10/19 0434 08/11/19 0525  NA 139 138  K 3.2* 3.6  CL 108 107  CO2 22 22  GLUCOSE 133* 124*  BUN 17 10  CREATININE 0.60 0.64  CALCIUM 7.7* 8.1*    PT/INR  No results for input(s): LABPROT, INR in the last 72 hours.  ABG   Recent Labs    08/09/19 1210  PHART 7.480*   HCO3 25.4     Studies/Results:  No results found.   Anti-infectives:   Anti-infectives (From admission, onward)   Start     Dose/Rate Route Frequency Ordered Stop   08/10/19 1800  ceFAZolin (ANCEF) IVPB 1 g/50 mL premix     Discontinue     1 g 100 mL/hr over 30 Minutes Intravenous Every 8 hours 08/10/19 1030     08/07/19 1800  cefTRIAXone (ROCEPHIN) 1 g in sodium chloride 0.9 % 100 mL IVPB  Status:  Discontinued        1 g 200 mL/hr over 30 Minutes Intravenous Every 24 hours 08/07/19 1713 62/69/48 5462      Jovahn Breit C Ty Buntrock, MD  [facility]

## 2019-08-13 LAB — GLUCOSE, CAPILLARY
Glucose-Capillary: 109 mg/dL — ABNORMAL HIGH (ref 70–99)
Glucose-Capillary: 119 mg/dL — ABNORMAL HIGH (ref 70–99)
Glucose-Capillary: 120 mg/dL — ABNORMAL HIGH (ref 70–99)

## 2019-08-13 LAB — CBC
HCT: 30.5 % — ABNORMAL LOW (ref 36.0–46.0)
Hemoglobin: 9.3 g/dL — ABNORMAL LOW (ref 12.0–15.0)
MCH: 26.4 pg (ref 26.0–34.0)
MCHC: 30.5 g/dL (ref 30.0–36.0)
MCV: 86.6 fL (ref 80.0–100.0)
Platelets: 360 10*3/uL (ref 150–400)
RBC: 3.52 MIL/uL — ABNORMAL LOW (ref 3.87–5.11)
RDW: 17 % — ABNORMAL HIGH (ref 11.5–15.5)
WBC: 10.2 10*3/uL (ref 4.0–10.5)
nRBC: 0 % (ref 0.0–0.2)

## 2019-08-13 LAB — BASIC METABOLIC PANEL
Anion gap: 9 (ref 5–15)
BUN: 6 mg/dL — ABNORMAL LOW (ref 8–23)
CO2: 23 mmol/L (ref 22–32)
Calcium: 8.9 mg/dL (ref 8.9–10.3)
Chloride: 106 mmol/L (ref 98–111)
Creatinine, Ser: 0.79 mg/dL (ref 0.44–1.00)
GFR calc Af Amer: >60 mL/min (ref >60)
GFR calc non Af Amer: >60 mL/min (ref >60)
Glucose, Bld: 123 mg/dL — ABNORMAL HIGH (ref 70–99)
Potassium: 4.5 mmol/L (ref 3.5–5.1)
Sodium: 138 mmol/L (ref 135–145)

## 2019-08-13 LAB — MAGNESIUM: Magnesium: 1.3 mg/dL — ABNORMAL LOW (ref 1.7–2.4)

## 2019-08-13 MED ORDER — CEPHALEXIN 500 MG PO CAPS
500.0000 mg | ORAL_CAPSULE | Freq: Two times a day (BID) | ORAL | 0 refills | Status: AC
Start: 2019-08-13 — End: ?

## 2019-08-13 MED ORDER — CARVEDILOL 6.25 MG PO TABS
6.2500 mg | ORAL_TABLET | Freq: Two times a day (BID) | ORAL | Status: DC
  Administered 2019-08-13: 6.25 mg via ORAL
  Filled 2019-08-13: qty 1

## 2019-08-13 NOTE — Progress Notes (Signed)
    CC: Abdominal pain, nausea and vomiting  Subjective: She is tolerating full liquids and having bowel movements.  She still does not want surgery  Objective: Vital signs in last 24 hours: Temp:  [98.2 F (36.8 C)-98.9 F (37.2 C)] 98.7 F (37.1 C) (07/26 0612) Pulse Rate:  [93-110] 93 (07/26 0612) Resp:  [18-22] 22 (07/26 0612) BP: (153-165)/(77-93) 153/77 (07/26 0612) SpO2:  [98 %-100 %] 100 % (07/26 0612) Weight:  [59.8 kg] 59.8 kg (07/26 0500) Last BM Date: 08/12/19 480 p.o. 750 IV 950 urine BM x3 Afebrile slightly tachycardic heart rate in the 100s BP is stable. Potassium 4.5, magnesium 1.3 WBC 10.2 H/H 9.3/30.5 CT of the abdomen and pelvis 7/20: Large fluid-filled hiatal hernia air-fluid distention of the stomach as well as borderline dilatation of the duodenum with air fluid densities in the entirety of the small bowel from the jejunum to the terminal ileum some thickening/inflammation seen centered in the ileocecal valve.  Colon was decompressed.  No evidence of perforation pneumatosis or portal venous gas evident. Intake/Output from previous day: 07/25 0701 - 07/26 0700 In: 1230 [P.O.:480; I.V.:600; IV Piggyback:150] Out: 950 [Urine:950] Intake/Output this shift: No intake/output data recorded.  General appearance: alert, cooperative, no distress and Appears tired, has not been out of bed today but did yesterday. GI: soft, non-tender; bowel sounds normal; no masses,  no organomegaly  Lab Results:  Recent Labs    08/12/19 0501 08/13/19 0550  WBC 10.6* 10.2  HGB 8.5* 9.3*  HCT 27.7* 30.5*  PLT 318 360    BMET Recent Labs    08/11/19 0525 08/13/19 0550  NA 138 138  K 3.6 4.5  CL 107 106  CO2 22 23  GLUCOSE 124* 123*  BUN 10 6*  CREATININE 0.64 0.79  CALCIUM 8.1* 8.9   PT/INR No results for input(s): LABPROT, INR in the last 72 hours.  Recent Labs  Lab 08/07/19 1519 08/08/19 0433 08/09/19 0509 08/11/19 0525  AST 35 58* 57* 19  ALT 24  45* 67* 33  ALKPHOS 58 55 51 46  BILITOT 0.3 0.5 0.5 0.3  PROT 6.8 6.0* 5.5* 5.1*  ALBUMIN 3.3* 3.0* 2.6* 2.5*     Lipase     Component Value Date/Time   LIPASE 17 08/01/2019 1431   LIPASE 146 09/01/2013 2320     Medications: . carvedilol  6.25 mg Oral BID WC  . heparin injection (subcutaneous)  5,000 Units Subcutaneous Q8H  . lisinopril  10 mg Oral Daily   And  . hydrochlorothiazide  12.5 mg Oral Daily  . pneumococcal 23 valent vaccine  0.5 mL Intramuscular Tomorrow-1000  . pramipexole  0.125 mg Oral QHS  . sodium chloride flush  3 mL Intravenous Once  . traZODone  50 mg Oral QHS   .  ceFAZolin (ANCEF) IV 1 g (08/13/19 1016)  . dextrose 5 % and 0.45 % NaCl with KCl 40 mEq/L 75 mL/hr at 08/13/19 0600    Assessment/Plan Klebsiella pneumonia UTI Severe malnutrition -prealbumin 7.7 Anemia Stage III CKD Hypertension Large hiatal hernia Severe scoliosis  Partial small bowel obstruction with possible ileocecal mass/inflammation  - Declined surgery  - Full liquids  FEN: IV fluids/full liquids ID: Ancef 7/23>> day 4 DVT: Heparin  Plan: Patient is tolerating full liquids, she continues to decline surgery.  We will be available please call if we can be of further assistance.    LOS: 12 days    Amber Hunter 08/13/2019 Please see Amion

## 2019-08-13 NOTE — TOC Transition Note (Signed)
Transition of Care [facility]) - CM/SW Discharge Note   Patient Details  Name: Amber Hunter MRN: [id number] Date of Birth: [birth year]  Transition of Care [facility] CM/SW Contact:  Ross Ludwig, LCSW Phone Number: 08/13/2019, 5:18 PM   Clinical Narrative:     Patient will be going home with home health through [facility].  CSW signing off please reconsult with any other social work needs, home health agency has been notified of planned discharge.  Rolling walker was delivered, and patient's son will be picking patient up.    Final next level of care: [place] Barriers to Discharge: Barriers Resolved   Patient Goals and CMS Choice Patient states their goals for this hospitalization and ongoing recovery are:: To return back home with home health. CMS Medicare.gov Compare Post Acute Care list provided to:: Patient Choice offered to / list presented to : Patient  Discharge Placement                       Discharge Plan and Services In-house Referral: Clinical Social Work Discharge Planning Services: CM Consult Post Acute Care Choice: Home Health          DME Arranged: Gilford Rile DME Agency: [employer] Date DME Agency Contacted: 08/13/19 Time DME Agency Contacted: 1400 Representative spoke with at DME Agency: [place]: PT [place]: [place] ([place]) Date [place]: 08/13/19 Time [place]: 1100 Representative spoke with at [place]: [place] ([place]) Interventions     Readmission Risk Interventions No flowsheet data found.

## 2019-08-13 NOTE — Progress Notes (Signed)
Physical Therapy Treatment Patient Details Name: Amber Hunter MRN: [id number] DOB: [birth year] Today's Date: 08/13/2019    History of Present Illness Pt is 86 y.o. female with history of hypertension, chronic kidney disease stage III, breast cancer in remission, restless leg syndrome, chronic pain on tramadol presents to the ER with complaints of nausea vomiting abdominal pain for last 3 days.  Pt admitted with SBO likely from adhesive disease.  Pt is currently with NG tube, no surgical intervention at this time.    PT Comments    Pt remains weak. She fatigues easily with minimal activity. Discussed d/c plan-pt stated she plans to return home where she lives with her son + daughter n Sports coach. Highly recommend 24 hour supervision/assist. Possible plan for d/c home later today.     Follow Up Recommendations  Home health PT;Supervision/Assistance - 24 hour     Equipment Recommendations       Recommendations for Other Services       Precautions / Restrictions Precautions Precautions: Fall Restrictions Weight Bearing Restrictions: No    Mobility  Bed Mobility Overal bed mobility: Needs Assistance Bed Mobility: Supine to Sit     Supine to sit: Mod assist;HOB elevated     General bed mobility comments: Assist for trunk and to scoot to EOB. Increased time.  Transfers Overall transfer level: Needs assistance Equipment used: Rolling walker (2 wheeled) Transfers: Sit to/from Stand Sit to Stand: Min assist;From elevated surface         General transfer comment: x 2. Once from elevated bed and once from toilet. VCs safety, technique hand placement. Assist to power up, stabilize, control descent.  Ambulation/Gait Ambulation/Gait assistance: Min assist Gait Distance (Feet): 15 Feet (x2) Assistive device: Rolling walker (2 wheeled) Gait Pattern/deviations: Trunk flexed;Decreased stride length;Step-through pattern     General Gait Details: Assist to stabilize throughout distance.  Pt remains weak and fatigues easily. Seated rest break taken/needed after 15 feet.   Stairs             Wheelchair Mobility    Modified Rankin (Stroke Patients Only)       Balance Overall balance assessment: Needs assistance         Standing balance support: Bilateral upper extremity supported Standing balance-Leahy Scale: Poor                              Cognition Arousal/Alertness: Awake/alert Behavior During Therapy: WFL for tasks assessed/performed Overall Cognitive Status: Within Functional Limits for tasks assessed                                        Exercises      General Comments        Pertinent Vitals/Pain Pain Assessment: No/denies pain    Home Living                      Prior Function            PT Goals (current goals can now be found in the care plan section) Progress towards PT goals: Progressing toward goals    Frequency    Min 3X/week      PT Plan Current plan remains appropriate    Co-evaluation              AM-PAC PT "6 Clicks" Mobility   Outcome  Measure  Help needed turning from your back to your side while in a flat bed without using bedrails?: A Little Help needed moving from lying on your back to sitting on the side of a flat bed without using bedrails?: A Lot Help needed moving to and from a bed to a chair (including a wheelchair)?: A Little Help needed standing up from a chair using your arms (e.g., wheelchair or bedside chair)?: A Little Help needed to walk in hospital room?: A Little Help needed climbing 3-5 steps with a railing? : A Lot 6 Click Score: 16    End of Session Equipment Utilized During Treatment: Gait belt Activity Tolerance: Patient limited by fatigue Patient left: in chair;with call bell/phone within reach;with chair alarm set   PT Visit Diagnosis: Muscle weakness (generalized) (M62.81);Unsteadiness on feet (R26.81)     Time: 7290-2111 PT  Time Calculation (min) (ACUTE ONLY): 18 min  Charges:  $Gait Training: 8-22 mins                        Doreatha Massed, PT Acute Rehabilitation  Office: [phone] Pager: [phone]

## 2019-08-13 NOTE — Discharge Summary (Signed)
Physician Discharge Summary  Amber Hunter DJM:[id number] DOB: 1928/02/13 DOA: 08/01/2019  PCP: Delrae Alfred, DO  Admit date: 08/01/2019 Discharge date: 08/13/2019  Admitted From: Home Disposition: Home   Recommendations for Outpatient Follow-up:  1. Follow up with PCP in 1-2 weeks  Home Health: PT Equipment/Devices: Rolling walker Discharge Condition: Stable CODE STATUS: Full Diet recommendation: Full liquids/soft diet as tolerated prior to discharge.  Brief/Interim Summary: Amber Hunter is an 86 y.o. female with a history of stage IIIa CKD, breast CA in remission, RLS, chronic pain who presented to [facility] 7/14 with nausea, vomiting, abdominal pain found to have imaging evidence of partial small bowel obstruction. Shew was admitted to the hospitalist service and treated conservatively per general surgery recommendations including NG tube decompression, IV fluids, and bowel rest. After passing clamping trial and seeing resolution of leukocytosis, NG tube was pulled and diet slowly advanced. On 7/20, the patient developed significant sinus tachycardia and abdominal pain with rising WBC. Ceftriaxone was started for nitrite-positive pyuria and bacteriuria. CT abd/pelvis was repeated showing persistent small bowel obstruction with transition point at a point of irregular thickening at the cecum. General surgery was reconsulted and recommended surgical evaluation. The patient persistently declined surgery so conservative measures were reinstated. Fortunately, with NG decompression, bowel rest, symptoms improved. NG tube was clamped 7/23, subsequently removed, and diet advanced slowly over the next few days with return of complete bowel function and continued resolution of obstructive symptoms. She is tolerating a full liquid diet without any pain, nausea, and wants to go home.  Discharge Diagnoses:  Principal Problem:   Partial small bowel obstruction (HCC) Active Problems:   Essential  hypertension   SBO (small bowel obstruction) (HCC)  SBO: With transition point near cecum which is irregularly thickened (?mass vs. inflammation). Previously suspected to be due to adhesions s/p appendectomy and hysterectomy. - Appreciate surgery's recommendations and discussions. SBO is currently resolved, and they are in agreement with her stability for discharge.   Sepsis due to Klebsiella UTI: Leukocytosis and sinus tachycardia satisfy SIRS. Also note bilateral perinephric stranding and bladder wall thickening on CT. Still with mild leukocytosis. - Continued improvement on antibiotics with resolution of fever and leukocytosis. Will continue keflex as guided by culture data.  - Blood cultures drawn after antibiotics are NG4D.  Severe protein calorie malnutrition: Prealbumin is 7.  - Tolerating diet. Consideration for PICC/TPN, though patient does not wish to pursue this.   Symptomatic anemia of acute illness on anemia of chronic disease: No bleeding noted.  - Hgb stable s/p 1u PRBCs 7/22.  HTN:  - Continue home medication  AKI on stage IIIa CKD: Resolved. No hydronephrosis on CT.  Hypokalemia due to GI losses:  - Resolved with supplementation and not having GI losses at this time.  Hyperglycemia due to prediabetes: Technically meets definition with HbA1c 5.8%.  - No plans for pharmacologic management.  Breast CA: In remission  RLS:  - Continue mirapex  Lactic acidosis: POA, resolved.   Depression:  - Continue citalopram  LFT elevation: Resolved. Was likely due to sepsis. Liver appeared normal on CT  Discharge Instructions Discharge Instructions    Diet - low sodium heart healthy   Complete by: As directed    Discharge instructions   Complete by: As directed    Please call call MD or return to ER for similar or worsening recurring problem that brought you to hospital or if any fever,nausea/vomiting,abdominal pain, uncontrolled pain, chest pain,  shortness of  breath or any other  alarming symptoms.  Please follow-up your doctor as instructed in a week time please check CBC and BMP. Call the office for appointment.  Please avoid alcohol, smoking, or any other illicit substance and maintain healthy habits including taking your regular medications as prescribed.  You were cared for by a hospitalist during your hospital stay. If you have any questions about your discharge medications or the care you received while you were in the hospital after you are discharged, you can call the unit and ask to speak with the hospitalist on call if the hospitalist that took care of you is not available.  Once you are discharged, your primary care physician will handle any further medical issues. Please note that NO REFILLS for any discharge medications will be authorized once you are discharged, as it is imperative that you return to your primary care physician (or establish a relationship with a primary care physician if you do not have one) for your aftercare needs so that they can reassess your need for medications and monitor your lab values   Discharge instructions   Complete by: As directed    You were admitted for small bowel obstruction that initially improved and then was felt to possibly be worsening. You developed a severe urinary tract infection due to E. coli which was treated with IV antibiotics and will need to continue treatment at home with keflex twice daily. You are tolerating a diet without abdominal pain, nausea or vomiting, have no fever, are passing bowel movements and are stable for discharge.   Increase activity slowly   Complete by: As directed      Allergies as of 08/13/2019   No Known Allergies     Medication List    STOP taking these medications   amoxicillin-clavulanate 500-125 MG tablet Commonly known as: Augmentin     TAKE these medications   cephALEXin 500 MG capsule Commonly known as: KEFLEX Take 1 capsule (500 mg total) by  mouth 2 (two) times daily.   citalopram 10 MG tablet Commonly known as: CELEXA Take 10 mg by mouth daily.   gabapentin 300 MG capsule Commonly known as: NEURONTIN Take 300 mg by mouth daily.   lisinopril-hydrochlorothiazide 10-12.5 MG tablet Commonly known as: ZESTORETIC Take 1 tablet by mouth daily.   pramipexole 0.125 MG tablet Commonly known as: MIRAPEX Take 0.125 mg by mouth at bedtime.   traMADol 50 MG tablet Commonly known as: ULTRAM Take 50 mg by mouth every 6 (six) hours as needed for moderate pain.   traZODone 50 MG tablet Commonly known as: DESYREL Take 50 mg by mouth at bedtime.            Durable Medical Equipment  (From admission, onward)         Start     Ordered   08/13/19 1053  DME Walker  Once       Question Answer Comment  Walker: With 5 Inch Wheels   Patient needs a walker to treat with the following condition Gait instability      08/13/19 1052          Follow-up Information    Delrae Alfred, DO Follow up in 1 week(s).   Specialty: Family Medicine Contact information: [place] [address] [phone]        [facility].   Specialty: [place] Why: This agency will provide physical therapy services. The agency will call you prior to the first visit.  [phone]             No Known Allergies  Consultations:  General surgery  Procedures/Studies: CT ABDOMEN PELVIS WO CONTRAST  Result Date: 08/07/2019 CLINICAL DATA:  Abdominal pain, SBO EXAM: CT ABDOMEN AND PELVIS WITHOUT CONTRAST TECHNIQUE: Multidetector CT imaging of the abdomen and pelvis was performed following the standard protocol without IV contrast. COMPARISON:  CT 08/01/2019 FINDINGS: Lower chest: Scarring versus atelectasis in the lingula. Additional atelectatic features in the left lower lobe adjacent the large left hiatal hernia. Normal cardiac size. Extensive three-vessel coronary artery  disease is noted. No pericardial effusion. Hepatobiliary: No visible focal liver lesion. Normal liver attenuation with smooth surface contour. Post cholecystectomy. No biliary ductal dilatation or visible intraductal gallstones accounting for age and reservoir effect. Pancreas: Partial fatty replacement of the pancreas. No pancreatic ductal dilatation or surrounding inflammatory changes. Spleen: Normal in size. No concerning splenic lesions. Adrenals/Urinary Tract: Stable mild lobular thickening of the adrenal glands, possibly some senescent hyperplasia, unchanged from prior without concerning dominant adrenal nodule. Kidneys are normally located. Stable mild bilateral perinephric stranding, nonspecific. No concerning renal mass. No urolithiasis or hydronephrosis. Urinary bladder is largely decompressed at the time of exam and therefore poorly evaluated by CT imaging. Mild wall thickening likely related to underdistention. Bladder evaluation also partially limited by extensive streak artifact from a right hip prosthesis. Stomach/Bowel: Large fluid-filled hiatal hernia. Air and fluid distention of the distal stomach as well. Borderline dilatation of the duodenum with air and fluid distention of nearly the entirety of the small bowel from jejunum to terminal ileum aside from a single partially decompressed segment in the right hemiabdomen though this loop did appear dilated on prior imaging and there is distension both upstream and downstream from this finding. Some irregular thickening and inflammation is again seen centered upon the ileocecal valve. Colon is largely decompressed. Scattered colonic diverticula without focal inflammation to suggest diverticulitis. Vascular/Lymphatic: Atherosclerotic calcifications within the abdominal aorta and branch vessels. No aneurysm or ectasia. No enlarged abdominopelvic lymph nodes. Reproductive: Uterus is surgically absent. No concerning adnexal lesions. Other: Inflammatory  changes in the mesentery of the right lower quadrant and about the ileocecal valve. Trace free fluid in the mesenteric leaflets. No discernible pneumatosis or portal venous gas is evident. No abdominopelvic free air. No bowel containing hernias. Musculoskeletal: Severe levocurvature of the lumbar spine with multilevel discogenic and facet degenerative changes are similar to prior. Prior right hip total arthroplasty with lateral acetabular screw fixation proud of the cortical surface, unchanged appearance from prior without other complication. Additional degenerative changes in the SI joints and left hip. IMPRESSION: 1. Evidence of persistent small bowel obstruction with most focal transition at the level of the cecum which demonstrates irregular thickening and surrounding inflammatory changes. Colon is largely decompressed. No evidence of perforation, discernible pneumatosis or portal venous gas is evident. Consider direct visualization following resolution of the immediate symptoms. 2. Large fluid-filled hiatal hernia. 3. Colonic diverticulosis without evidence of diverticulitis. 4. Prior right hip total arthroplasty with lateral acetabular screw fixation proud of the cortical surface, unchanged from prior. 5. Aortic Atherosclerosis (ICD10-I70.0). 6. Coronary artery atherosclerosis. Electronically Signed   By: Lovena Le M.D.   On: 08/07/2019 19:10   CT ABDOMEN PELVIS WO CONTRAST  Result Date: 08/01/2019 CLINICAL DATA:  Severe pain EXAM: CT ABDOMEN AND PELVIS WITHOUT CONTRAST TECHNIQUE: Multidetector CT imaging of the abdomen and pelvis was performed following the standard protocol without IV contrast. COMPARISON:  September 02, 2013. FINDINGS: Lower  chest: The visualized heart size within normal limits. No pericardial fluid/thickening. There is a moderate to large paraesophageal hernia present. A right effusion is present. Hepatobiliary: The liver is normal in density without focal abnormality.The main portal  vein is patent. The patient is status post cholecystectomy. No biliary ductal dilation. Pancreas: Unremarkable. No pancreatic ductal dilatation or surrounding inflammatory changes. Spleen: Normal in size without focal abnormality. Adrenals/Urinary Tract: Both adrenal glands appear normal. The kidneys and collecting system appear normal without evidence of urinary tract calculus or hydronephrosis. Bladder is unremarkable. Stomach/Bowel: A moderate to large paraesophageal hernia is noted. There is moderately dilated air loops measuring to 3.3 cm to the distal ileum. The terminal ileum appears to be decompressed there appears to be question thickening with surrounding fat stranding changes seen around the cecum. There is air and stool seen throughout the remainder of the colon to the rectum. No pericolonic free free air is. Vascular/Lymphatic: There are no enlarged mesenteric, retroperitoneal, or pelvic lymph nodes. Scattered aortic atherosclerotic calcifications are seen without aneurysmal dilatation. Reproductive: The patient is status post hysterectomy. No adnexal masses or collections seen. Other: No evidence of abdominal wall mass or hernia. Musculoskeletal: No acute or significant osseous findings. There is a levoconvex scoliotic curvature of the lumbar. Chronic superior compression deformity of the L1 vertebral bodies seen than 5% height. IMPRESSION: 1. Findings consistent with a partial small obstruction to the level of the terminal ileum which appears to be decompressed. There is wall thickening with stranding changes the cecum. Non-specific could be due to colitis however cannot underlying obstructing lesion. Upon resolution of symptoms would recommend direct visualization. 2. Moderate to large paraesophageal hernia 3.  Aortic Atherosclerosis (ICD10-I70.0). 4. Small right effusion Electronically Signed   By: Prudencio Pair M.D.   On: 08/01/2019 16:50   DG Abd 1 View  Result Date: 08/05/2019 CLINICAL DATA:   Small bowel obstruction. EXAM: ABDOMEN - 1 VIEW COMPARISON:  August 03, 2019. FINDINGS: Mildly dilated small bowel loops are noted concerning for small bowel obstruction. No colonic dilatation is noted. Residual contrast is noted in the distal sigmoid colon and rectum. IMPRESSION: Mildly dilated small bowel loops are noted concerning for small bowel obstruction. Electronically Signed   By: Marijo Conception M.D.   On: 08/05/2019 12:54   DG Abd 1 View  Result Date: 08/02/2019 CLINICAL DATA:  Small-bowel obstruction follow-up. EXAM: ABDOMEN - 1 VIEW COMPARISON:  CT scan 08/01/2019 FINDINGS: Persistent dilated air-filled loops of small bowel consistent with small-bowel obstruction. Some residual stool in the rectosigmoid area. No free air is identified. IMPRESSION: Persistent small-bowel obstruction bowel gas pattern. Electronically Signed   By: Marijo Sanes M.D.   On: 08/02/2019 07:03   DG Chest Port 1 View  Result Date: 08/07/2019 CLINICAL DATA:  Pt has onset tachycardia and Hx of HTN EXAM: PORTABLE CHEST 1 VIEW COMPARISON:  August 01, 2019; January 15, 2018 FINDINGS: The cardiomediastinal silhouette is unchanged in contour.Large hiatal hernia. Unchanged elevation of the right hemidiaphragm. No pleural effusion. No pneumothorax. No acute pleuroparenchymal abnormality. Surgical clips project over the right upper quadrant. Multilevel degenerative changes of the thoracic spine. Likely remote posttraumatic changes of the right shoulder. IMPRESSION: No acute cardiopulmonary abnormality.  Large hiatal hernia. Electronically Signed   By: Valentino Saxon MD   On: 08/07/2019 15:40   DG Abd 2 Views  Result Date: 08/09/2019 CLINICAL DATA:  Possible intraperitoneal free air. EXAM: ABDOMEN - 2 VIEW COMPARISON:  08/08/2019 FINDINGS: Supine abdomen shows mild gaseous  distention of small bowel in the central abdomen with loops measuring up to 4 cm diameter. Right-side-up decubitus film shows no intraperitoneal free air.  Air-fluid levels are seen in the distended central small bowel loops. Bones are diffusely demineralized with convex leftward upper lumbar scoliosis. Patient is status post right hip replacement. IMPRESSION: 1. No evidence for intraperitoneal free air on the right-side-up decubitus film. 2. Mild gaseous distention of small bowel loops with air-fluid levels, minimally decreased since 08/08/2019. Ileus or obstruction not excluded. Electronically Signed   By: Misty Stanley M.D.   On: 08/09/2019 11:58   DG Abd 2 Views  Result Date: 08/06/2019 CLINICAL DATA:  Small-bowel obstruction, abdominal pain EXAM: ABDOMEN - 2 VIEW COMPARISON:  08/05/2019 FINDINGS: Air distended loops of small bowel within the mid abdomen measuring approximately 3 cm in diameter, slightly decreased from prior study. There is air present within the large bowel. No significant residual enteric contrast is seen within the abdomen. No gross free intraperitoneal air on semi upright view. Thoracolumbar levo scoliotic curvature. IMPRESSION: 1. Improving degree of small-bowel dilatation suggestive of resolving small bowel obstruction. Continued radiographic surveillance is suggested. 2. No significant residual enteric contrast within the abdomen. Electronically Signed   By: Davina Poke D.O.   On: 08/06/2019 09:21   DG Abd Portable 1V  Result Date: 08/08/2019 CLINICAL DATA:  Nasogastric tube placement. EXAM: PORTABLE ABDOMEN - 1 VIEW COMPARISON:  08/03/2019 FINDINGS: A gastric tube has been placed, tip overlying the level of the stomach. Patient is rotated. There is significant dilatation of small bowel loops. No evidence for free intraperitoneal air. Large hiatal hernia noted. Scoliosis and degenerative changes. IMPRESSION: 1. Interval placement of gastric tube. 2. Small bowel obstruction. Electronically Signed   By: Nolon Nations M.D.   On: 08/08/2019 15:03   DG Abd Portable 1V  Result Date: 08/03/2019 CLINICAL DATA:  Follow-up bowel  obstruction. EXAM: PORTABLE ABDOMEN - 1 VIEW COMPARISON:  08/02/2019 FINDINGS: There is a nasogastric tube within the upper abdomen in the expected location of the distal stomach. Contrast opacification of the dilated small bowel loops again noted. Compared with the previous exam there is been mild interval improvement in the degree of small-bowel dilatation. Enteric contrast material is now noted within the colon at the level of the hepatic flexure. IMPRESSION: Mild interval improvement in small bowel obstruction pattern. Enteric contrast material is now noted within the colon at the level of the hepatic flexure. Electronically Signed   By: Kerby Moors M.D.   On: 08/03/2019 10:27   DG Abd Portable 1V-Small Bowel Obstruction Protocol-initial, 8 hr delay  Result Date: 08/02/2019 CLINICAL DATA:  Small-bowel obstruction EXAM: PORTABLE ABDOMEN - 1 VIEW COMPARISON:  11:30 a.m. FINDINGS: Nasogastric tube overlies the mid body of the stomach. Numerous dilated gas and contrast filled loops of small bowel are seen throughout the abdomen in keeping with a distal small-bowel obstruction. The degree of small-bowel dilation appears unchanged from prior examination. The large bowel appears decompressed. No gross free intraperitoneal gas. No other changes identified. IMPRESSION: Stable findings of distal small bowel obstruction. Contrast from prior CT examination has now progressed into the distal small bowel, but has not yet passed into the colon. Electronically Signed   By: Fidela Salisbury MD   On: 08/02/2019 22:49   DG Abd Portable 1V-Small Bowel Protocol-Position Verification  Result Date: 08/02/2019 CLINICAL DATA:  Nasogastric placement EXAM: PORTABLE ABDOMEN - 1 VIEW COMPARISON:  Earlier same day FINDINGS: Nasogastric 2 passes through the hiatal  hernia, under the diaphragm into the expected location of the gastric antrum or pylorus. Small-bowel obstruction pattern as seen previously. IMPRESSION: Nasogastric tube  enters the abdomen, with the tip in the expected location of the antrum or pylorus. Electronically Signed   By: Nelson Chimes M.D.   On: 08/02/2019 13:33   Subjective: Feels well, eating without nausea or vomiting or abdominal pain. Having normal BMs. No fever, dysuria or flank pain.   Discharge Exam: Vitals:   08/13/19 0200 08/13/19 0612  BP:  (!) 153/77  Pulse:  93  Resp: 18 22  Temp:  98.7 F (37.1 C)  SpO2:  100%   General: Pt is alert, awake, not in acute distress Cardiovascular: RRR, S1/S2 +, no rubs, no gallops Respiratory: CTA bilaterally, no wheezing, no rhonchi Abdominal: Soft, NT, ND, bowel sounds + Extremities: No edema, no cyanosis  Labs: Basic Metabolic Panel: Recent Labs  Lab 08/08/19 0433 08/09/19 0509 08/10/19 0434 08/11/19 0525 08/13/19 0550  NA 139 141 139 138 138  K 3.7 3.4* 3.2* 3.6 4.5  CL 105 106 108 107 106  CO2 24 23 22 22 23   GLUCOSE 151* 120* 133* 124* 123*  BUN 28* 34* 17 10 6*  CREATININE 0.80 1.13* 0.60 0.64 0.79  CALCIUM 8.6* 7.8* 7.7* 8.1* 8.9  MG  --   --   --   --  1.3*   Liver Function Tests: Recent Labs  Lab 08/07/19 1519 08/08/19 0433 08/09/19 0509 08/11/19 0525  AST 35 58* 57* 19  ALT 24 45* 67* 33  ALKPHOS 58 55 51 46  BILITOT 0.3 0.5 0.5 0.3  PROT 6.8 6.0* 5.5* 5.1*  ALBUMIN 3.3* 3.0* 2.6* 2.5*   No results for input(s): LIPASE, AMYLASE in the last 168 hours. No results for input(s): AMMONIA in the last 168 hours. CBC: Recent Labs  Lab 08/09/19 0509 08/10/19 0434 08/11/19 0525 08/12/19 0501 08/13/19 0550  WBC 26.6* 18.2* 13.9* 10.6* 10.2  NEUTROABS 22.4* 14.7*  --   --   --   HGB 7.0* 8.6* 8.3* 8.5* 9.3*  HCT 23.7* 27.8* 27.2* 27.7* 30.5*  MCV 85.9 85.3 87.5 86.3 86.6  PLT 312 295 287 318 360   Cardiac Enzymes: No results for input(s): CKTOTAL, CKMB, CKMBINDEX, TROPONINI in the last 168 hours. BNP: Invalid input(s): POCBNP CBG: Recent Labs  Lab 08/11/19 2339 08/12/19 0822 08/12/19 1622  08/13/19 0000 08/13/19 0735  GLUCAP 117* 94 103* 119* 109*   D-Dimer No results for input(s): DDIMER in the last 72 hours. Hgb A1c No results for input(s): HGBA1C in the last 72 hours. Lipid Profile No results for input(s): CHOL, HDL, LDLCALC, TRIG, CHOLHDL, LDLDIRECT in the last 72 hours. Thyroid function studies No results for input(s): TSH, T4TOTAL, T3FREE, THYROIDAB in the last 72 hours.  Invalid input(s): FREET3 Anemia work up No results for input(s): VITAMINB12, FOLATE, FERRITIN, TIBC, IRON, RETICCTPCT in the last 72 hours. Urinalysis    Component Value Date/Time   COLORURINE YELLOW 08/07/2019 1600   APPEARANCEUR TURBID (A) 08/07/2019 1600   APPEARANCEUR Hazy 09/02/2013 0055   LABSPEC 1.017 08/07/2019 1600   LABSPEC 1.010 09/02/2013 0055   PHURINE 5.0 08/07/2019 1600   GLUCOSEU NEGATIVE 08/07/2019 1600   GLUCOSEU 50 mg/dL 09/02/2013 0055   HGBUR MODERATE (A) 08/07/2019 1600   BILIRUBINUR NEGATIVE 08/07/2019 1600   BILIRUBINUR Negative 09/02/2013 0055   KETONESUR NEGATIVE 08/07/2019 1600   PROTEINUR 30 (A) 08/07/2019 1600   NITRITE POSITIVE (A) 08/07/2019 1600   LEUKOCYTESUR  MODERATE (A) 08/07/2019 1600   LEUKOCYTESUR Trace 09/02/2013 0055    Microbiology Recent Results (from the past 240 hour(s))  Culture, Urine     Status: Abnormal   Collection Time: 08/07/19  4:00 PM   Specimen: Urine, Random  Result Value Ref Range Status   Specimen Description   Final    URINE, RANDOM Performed at [facility] [address], [place], [place] [address]    Special Requests   Final    add on to earlier sample Performed at [facility], [place] [address] [place], [place] [address]    Culture >=100,000 COLONIES/mL KLEBSIELLA PNEUMONIAE (A)  Final   Report Status 08/09/2019 FINAL  Final   Organism ID, Bacteria KLEBSIELLA PNEUMONIAE (A)  Final      Susceptibility   Klebsiella pneumoniae - MIC*    AMPICILLIN RESISTANT Resistant      CEFAZOLIN <=4 SENSITIVE Sensitive     CEFTRIAXONE <=0.25 SENSITIVE Sensitive     CIPROFLOXACIN <=0.25 SENSITIVE Sensitive     GENTAMICIN <=1 SENSITIVE Sensitive     IMIPENEM <=0.25 SENSITIVE Sensitive     NITROFURANTOIN 32 SENSITIVE Sensitive     TRIMETH/SULFA <=20 SENSITIVE Sensitive     AMPICILLIN/SULBACTAM 4 SENSITIVE Sensitive     PIP/TAZO <=4 SENSITIVE Sensitive     * >=100,000 COLONIES/mL KLEBSIELLA PNEUMONIAE  Culture, blood (routine x 2)     Status: None (Preliminary result)   Collection Time: 08/09/19  8:50 AM   Specimen: BLOOD  Result Value Ref Range Status   Specimen Description   Final    BLOOD RIGHT ARM Performed at [facility] [address], [place], [place] [address]    Special Requests   Final    BOTTLES DRAWN AEROBIC ONLY Blood Culture results may not be optimal due to an inadequate volume of blood received in culture bottles Performed at [facility] [address] [place], [place] [address]    Culture   Final    NO GROWTH 4 DAYS Performed at [facility], [place] [address], [place], [place] [address]    Report Status PENDING  Incomplete  Culture, blood (routine x 2)     Status: None (Preliminary result)   Collection Time: 08/09/19  4:16 PM   Specimen: BLOOD RIGHT HAND  Result Value Ref Range Status   Specimen Description   Final    BLOOD RIGHT HAND Performed at [facility] [address] [place], [place] [address]    Special Requests   Final    BOTTLES DRAWN AEROBIC ONLY Blood Culture results may not be optimal due to an inadequate volume of blood received in culture bottles Performed at [facility] [address], [place], [place] [address]    Culture   Final    NO GROWTH 4 DAYS Performed at [facility], [place] [address], [place], [place] [address]    Report Status PENDING  Incomplete    Time coordinating discharge: Approximately 40 minutes  Patrecia Pour,  MD  [facility] 08/13/2019, 4:08 PM

## 2019-08-14 LAB — CULTURE, BLOOD (ROUTINE X 2)
Culture: NO GROWTH
Culture: NO GROWTH

## 2019-12-12 ENCOUNTER — Emergency Department (HOSPITAL_BASED_OUTPATIENT_CLINIC_OR_DEPARTMENT_OTHER)
Admission: EM | Admit: 2019-12-12 | Discharge: 2019-12-13 | Disposition: A | Discharge status: Other Acute Inpatient Hospital | Payer: Medicare Other | Attending: Emergency Medicine | Admitting: Emergency Medicine

## 2019-12-12 ENCOUNTER — Encounter (HOSPITAL_BASED_OUTPATIENT_CLINIC_OR_DEPARTMENT_OTHER): Payer: Self-pay | Admitting: *Deleted

## 2019-12-12 ENCOUNTER — Other Ambulatory Visit: Payer: Self-pay

## 2019-12-12 ENCOUNTER — Emergency Department (HOSPITAL_BASED_OUTPATIENT_CLINIC_OR_DEPARTMENT_OTHER): Payer: Medicare Other

## 2019-12-12 DIAGNOSIS — Z79899 Other long term (current) drug therapy: Secondary | ICD-10-CM | POA: Diagnosis not present

## 2019-12-12 DIAGNOSIS — R1031 Right lower quadrant pain: Secondary | ICD-10-CM | POA: Diagnosis present

## 2019-12-12 DIAGNOSIS — K6389 Other specified diseases of intestine: Secondary | ICD-10-CM | POA: Insufficient documentation

## 2019-12-12 DIAGNOSIS — R300 Dysuria: Secondary | ICD-10-CM | POA: Diagnosis not present

## 2019-12-12 DIAGNOSIS — Z20822 Contact with and (suspected) exposure to covid-19: Secondary | ICD-10-CM | POA: Insufficient documentation

## 2019-12-12 DIAGNOSIS — K631 Perforation of intestine (nontraumatic): Secondary | ICD-10-CM | POA: Diagnosis not present

## 2019-12-12 DIAGNOSIS — A419 Sepsis, unspecified organism: Secondary | ICD-10-CM | POA: Insufficient documentation

## 2019-12-12 DIAGNOSIS — I10 Essential (primary) hypertension: Secondary | ICD-10-CM | POA: Insufficient documentation

## 2019-12-12 DIAGNOSIS — C799 Secondary malignant neoplasm of unspecified site: Secondary | ICD-10-CM | POA: Diagnosis not present

## 2019-12-12 HISTORY — DX: Urinary tract infection, site not specified: N39.0

## 2019-12-12 LAB — URINALYSIS, MICROSCOPIC (REFLEX): WBC, UA: NONE SEEN WBC/hpf (ref 0–5)

## 2019-12-12 LAB — CBC WITH DIFFERENTIAL/PLATELET
Abs Immature Granulocytes: 0.2 10*3/uL — ABNORMAL HIGH (ref 0.00–0.07)
Basophils Absolute: 0.1 10*3/uL (ref 0.0–0.1)
Basophils Relative: 0 %
Eosinophils Absolute: 0 10*3/uL (ref 0.0–0.5)
Eosinophils Relative: 0 %
HCT: 31 % — ABNORMAL LOW (ref 36.0–46.0)
Hemoglobin: 9.5 g/dL — ABNORMAL LOW (ref 12.0–15.0)
Immature Granulocytes: 1 %
Lymphocytes Relative: 6 %
Lymphs Abs: 1.3 10*3/uL (ref 0.7–4.0)
MCH: 27.4 pg (ref 26.0–34.0)
MCHC: 30.6 g/dL (ref 30.0–36.0)
MCV: 89.3 fL (ref 80.0–100.0)
Monocytes Absolute: 1.2 10*3/uL — ABNORMAL HIGH (ref 0.1–1.0)
Monocytes Relative: 5 %
Neutro Abs: 20 10*3/uL — ABNORMAL HIGH (ref 1.7–7.7)
Neutrophils Relative %: 88 %
Platelets: 298 10*3/uL (ref 150–400)
RBC: 3.47 MIL/uL — ABNORMAL LOW (ref 3.87–5.11)
RDW: 23.3 % — ABNORMAL HIGH (ref 11.5–15.5)
WBC: 22.8 10*3/uL — ABNORMAL HIGH (ref 4.0–10.5)
nRBC: 0 % (ref 0.0–0.2)

## 2019-12-12 LAB — COMPREHENSIVE METABOLIC PANEL
ALT: 10 U/L (ref 0–44)
AST: 16 U/L (ref 15–41)
Albumin: 3.2 g/dL — ABNORMAL LOW (ref 3.5–5.0)
Alkaline Phosphatase: 54 U/L (ref 38–126)
Anion gap: 8 (ref 5–15)
BUN: 17 mg/dL (ref 8–23)
CO2: 25 mmol/L (ref 22–32)
Calcium: 8.9 mg/dL (ref 8.9–10.3)
Chloride: 99 mmol/L (ref 98–111)
Creatinine, Ser: 0.93 mg/dL (ref 0.44–1.00)
GFR, Estimated: 59 mL/min — ABNORMAL LOW (ref >60)
Glucose, Bld: 155 mg/dL — ABNORMAL HIGH (ref 70–99)
Potassium: 3.5 mmol/L (ref 3.5–5.1)
Sodium: 132 mmol/L — ABNORMAL LOW (ref 135–145)
Total Bilirubin: 0.6 mg/dL (ref 0.3–1.2)
Total Protein: 7 g/dL (ref 6.5–8.1)

## 2019-12-12 LAB — URINALYSIS, ROUTINE W REFLEX MICROSCOPIC
Bilirubin Urine: NEGATIVE
Glucose, UA: NEGATIVE mg/dL
Ketones, ur: NEGATIVE mg/dL
Leukocytes,Ua: NEGATIVE
Nitrite: NEGATIVE
Protein, ur: NEGATIVE mg/dL
Specific Gravity, Urine: 1.015 (ref 1.005–1.030)
pH: 7 (ref 5.0–8.0)

## 2019-12-12 LAB — PROTIME-INR
INR: 1.2 (ref 0.8–1.2)
Prothrombin Time: 14.4 seconds (ref 11.4–15.2)

## 2019-12-12 LAB — RESP PANEL BY RT-PCR (FLU A&B, COVID) ARPGX2
Influenza A by PCR: NEGATIVE
Influenza B by PCR: NEGATIVE
SARS Coronavirus 2 by RT PCR: NEGATIVE

## 2019-12-12 LAB — LACTIC ACID, PLASMA
Lactic Acid, Venous: 2.7 mmol/L (ref 0.5–1.9)
Lactic Acid, Venous: 1.9 mmol/L (ref 0.5–1.9)

## 2019-12-12 LAB — APTT: aPTT: 33 seconds (ref 24–36)

## 2019-12-12 MED ORDER — MORPHINE SULFATE (PF) 4 MG/ML IV SOLN
4.0000 mg | Freq: Once | INTRAVENOUS | Status: AC
  Administered 2019-12-12: 4 mg via INTRAVENOUS
  Filled 2019-12-12: qty 1

## 2019-12-12 MED ORDER — IOHEXOL 300 MG/ML  SOLN
100.0000 mL | Freq: Once | INTRAMUSCULAR | Status: AC
  Administered 2019-12-12: 100 mL via INTRAVENOUS

## 2019-12-12 MED ORDER — SODIUM CHLORIDE 0.9 % IV BOLUS
1000.0000 mL | Freq: Once | INTRAVENOUS | Status: AC
  Administered 2019-12-12: 1000 mL via INTRAVENOUS

## 2019-12-12 MED ORDER — SODIUM CHLORIDE 0.9 % IV SOLN
1.0000 g | Freq: Once | INTRAVENOUS | Status: AC
  Administered 2019-12-12: 1 g via INTRAVENOUS
  Filled 2019-12-12: qty 10

## 2019-12-12 MED ORDER — PIPERACILLIN-TAZOBACTAM 3.375 G IVPB: 3.3750 g | Freq: Three times a day (TID) | INTRAVENOUS | Status: DC

## 2019-12-12 MED ORDER — PIPERACILLIN-TAZOBACTAM 3.375 G IVPB 30 MIN
3.3750 g | Freq: Once | INTRAVENOUS | Status: AC
  Administered 2019-12-12: 3.375 g via INTRAVENOUS
  Filled 2019-12-12: qty 50

## 2019-12-12 MED ORDER — ONDANSETRON HCL 4 MG/2ML IJ SOLN
4.0000 mg | Freq: Once | INTRAMUSCULAR | Status: AC
  Administered 2019-12-12: 4 mg via INTRAVENOUS
  Filled 2019-12-12: qty 2

## 2019-12-12 NOTE — Progress Notes (Signed)
Pharmacy Antibiotic Note  Amber Hunter is a 86 y.o. female admitted on 12/12/2019 with perforated bowel.  Pharmacy has been consulted for zosyn dosing. Pt with Tmax 100.2 and WBC is elevated at 22.8. SCr is WNL. Lactic acid is >2.   Plan: Zosyn 3.375gm IV Q8H (4 hr inf) F/u renal fxn, C&S, clinical status   Height: 5\' 4"  (162.6 cm) Weight: 65.8 kg (145 lb) IBW/kg (Calculated) : 54.7  Temp (24hrs), Avg:100.2 F (37.9 C), Min:100.2 F (37.9 C), Max:100.2 F (37.9 C)  Recent Labs  Lab 12/12/19 2128  WBC 22.8*  CREATININE 0.93  LATICACIDVEN 2.7*    Estimated Creatinine Clearance: 38.3 mL/min (by C-G formula based on SCr of 0.93 mg/dL).    No Known Allergies  Antimicrobials this admission: Zosyn 11/24>> CTX x 1 11/24  Dose adjustments this admission: N/A  Microbiology results: Pending  Thank you for allowing pharmacy to be a part of this patient's care.  Aziz Slape, Rande Lawman 12/12/2019 10:58 PM

## 2019-12-12 NOTE — ED Notes (Signed)
Patient transported to CT 

## 2019-12-12 NOTE — ED Provider Notes (Signed)
[facility] Provider Note   CSN: [id number] Arrival date & time: 12/12/19  2052     History Chief Complaint  Patient presents with  . Flank Pain    Amber Hunter is a 86 y.o. female.  HPI     86 year old female with history of hypertension, breast cancer, admission to [facility] long hospital and July for small bowel obstruction with concern for irregular thickening, at which time patient elected to forego endoscopy and exploratory lap, iron deficiency anemia for which she sees Dr. Harlow Asa hematology/oncology, presents with concern for fever, abdominal pain, nausea and vomiting.  Reports that symptoms began today, with right lower quadrant and right sided back pain.  Reports that the pain is severe.  Reports associated nausea and vomiting.  Denies constipation or diarrhea.  Reports she has had some dysuria today as well.  Low appetite, generally weak.  No cough, congestion, shortness of breath, chest pain.  She has not had her COVID-19 vaccine.    Past Medical History:  Diagnosis Date  . Breast cancer ([place])   . Hypertension   . UTI (urinary tract infection)     Patient Active Problem List   Diagnosis Date Noted  . Partial small bowel obstruction ([place]) 08/01/2019  . Essential hypertension 08/01/2019  . SBO (small bowel obstruction) ([place]) 08/01/2019    Past Surgical History:  Procedure Laterality Date  . BREAST LUMPECTOMY       OB History   No obstetric history on file.     Family History  Family history unknown: Yes    Social History   Tobacco Use  . Smoking status: Never Smoker  . Smokeless tobacco: Never Used  Vaping Use  . Vaping Use: Never used  Substance Use Topics  . Alcohol use: Never  . Drug use: Never    Home Medications Prior to Admission medications   Medication Sig Start Date End Date Taking? Authorizing Provider  cephALEXin (KEFLEX) 500 MG capsule Take 1 capsule (500 mg total) by mouth 2 (two) times daily.  08/13/19   Patrecia Pour, MD  citalopram (CELEXA) 10 MG tablet Take 10 mg by mouth daily.    [provider]  gabapentin (NEURONTIN) 300 MG capsule Take 300 mg by mouth daily. 07/02/19   [provider]  lisinopril-hydrochlorothiazide (PRINZIDE,ZESTORETIC) 10-12.5 MG tablet Take 1 tablet by mouth daily.    [provider]  pramipexole (MIRAPEX) 0.125 MG tablet Take 0.125 mg by mouth at bedtime.    [provider]  traMADol (ULTRAM) 50 MG tablet Take 50 mg by mouth every 6 (six) hours as needed for moderate pain.     [provider]  traZODone (DESYREL) 50 MG tablet Take 50 mg by mouth at bedtime.    [provider]    Allergies    Patient has no known allergies.  Review of Systems   Review of Systems  Constitutional: Positive for fatigue and fever.  HENT: Negative for sore throat.   Eyes: Negative for visual disturbance.  Respiratory: Negative for cough and shortness of breath.   Cardiovascular: Negative for chest pain.  Gastrointestinal: Positive for abdominal pain, nausea and vomiting. Negative for constipation and diarrhea.  Genitourinary: Negative for difficulty urinating.  Musculoskeletal: Negative for back pain and neck pain.  Skin: Negative for rash.  Neurological: Negative for syncope and headaches.    Physical Exam Updated Vital Signs BP (!) 147/56   Pulse (!) 107   Temp 100.2 F (37.9 C) (Oral)  Resp (!) 26   Ht 5\' 4"  (1.626 m)   Wt 65.8 kg   SpO2 96%   BMI 24.89 kg/m   Physical Exam Vitals and nursing note reviewed.  Constitutional:      General: She is not in acute distress.    Appearance: She is well-developed. She is not diaphoretic.  HENT:     Head: Normocephalic and atraumatic.  Eyes:     Conjunctiva/sclera: Conjunctivae normal.  Cardiovascular:     Rate and Rhythm: Normal rate and regular rhythm.  Pulmonary:     Effort: Pulmonary effort is normal. No respiratory distress.  Abdominal:      General: There is no distension.     Palpations: Abdomen is soft.     Tenderness: There is abdominal tenderness (RLQ tenderness). There is right CVA tenderness. There is no guarding.  Musculoskeletal:        General: No tenderness.     Cervical back: Normal range of motion.  Skin:    General: Skin is warm and dry.     Findings: No erythema or rash.  Neurological:     Mental Status: She is alert and oriented to person, place, and time.     ED Results / Procedures / Treatments   Labs (all labs ordered are listed, but only abnormal results are displayed) Labs Reviewed  URINALYSIS, ROUTINE W REFLEX MICROSCOPIC - Abnormal; Notable for the following components:      Result Value   APPearance HAZY (*)    Hgb urine dipstick MODERATE (*)    All other components within normal limits  LACTIC ACID, PLASMA - Abnormal; Notable for the following components:   Lactic Acid, Venous 2.7 (*)    All other components within normal limits  COMPREHENSIVE METABOLIC PANEL - Abnormal; Notable for the following components:   Sodium 132 (*)    Glucose, Bld 155 (*)    Albumin 3.2 (*)    GFR, Estimated 59 (*)    All other components within normal limits  CBC WITH DIFFERENTIAL/PLATELET - Abnormal; Notable for the following components:   WBC 22.8 (*)    RBC 3.47 (*)    Hemoglobin 9.5 (*)    HCT 31.0 (*)    RDW 23.3 (*)    Neutro Abs 20.0 (*)    Monocytes Absolute 1.2 (*)    Abs Immature Granulocytes 0.20 (*)    All other components within normal limits  URINALYSIS, MICROSCOPIC (REFLEX) - Abnormal; Notable for the following components:   Bacteria, UA RARE (*)    All other components within normal limits  RESP PANEL BY RT-PCR (FLU A&B, COVID) ARPGX2  URINE CULTURE  CULTURE, BLOOD (ROUTINE X 2)  CULTURE, BLOOD (ROUTINE X 2)  LACTIC ACID, PLASMA  PROTIME-INR  APTT    EKG None  Radiology CT ABDOMEN PELVIS W CONTRAST  Result Date: 12/12/2019 CLINICAL DATA:  86 year old female with flank pain.  Concern for kidney stone. EXAM: CT ABDOMEN AND PELVIS WITH CONTRAST TECHNIQUE: Multidetector CT imaging of the abdomen and pelvis was performed using the standard protocol following bolus administration of intravenous contrast. CONTRAST:  155mL OMNIPAQUE IOHEXOL 300 MG/ML  SOLN COMPARISON:  CT abdomen pelvis dated 08/07/2019. FINDINGS: Lower chest: There is a 13 mm nodule in the left lower lobe, new since the prior CT. Additional 10 mm right middle lobe nodule versus focal atelectasis. Findings are concerning for metastatic disease. There is coronary vascular calcification. No intra-abdominal free air or free fluid. Hepatobiliary: 2 hepatic hypodense lesions with  irregular margins measuring up to 19 mm, new since the prior CT and most consistent with metastatic disease. There is cholecystectomy. Mild biliary ductal dilatation. Pancreas: Unremarkable. No pancreatic ductal dilatation or surrounding inflammatory changes. Spleen: Normal in size without focal abnormality. Adrenals/Urinary Tract: Mild right adrenal thickening or nodularity measuring 13 mm. The left adrenal gland is unremarkable. There is no hydronephrosis on either side. There is symmetric enhancement and excretion of contrast by both kidneys. Subcentimeter left renal hypodense lesions are too small to characterize. The visualized ureters and urinary bladder appear unremarkable. Stomach/Bowel: There is severe sigmoid diverticulosis without active inflammatory changes. There is moderate stool throughout the colon. There is irregular circumferential thickening of the cecum most consistent with mass/malignancy. There is extension of the nodular soft tissue into the terminal ileum. There is discontinuity and perforation of the base of the cecum with small pockets of extraluminal air consistent with focally contained perforations. There is a 3.1 x 2.0 cm focal area of extraluminal nodularity posterior to the cecum (50/2). There is a moderate size hiatal  hernia. There is no bowel obstruction. The appendix is not identified with certainty. Vascular/Lymphatic: Advanced aortoiliac atherosclerotic disease. The IVC is unremarkable. No portal venous gas. No adenopathy. Reproductive: Hysterectomy. Other: None Musculoskeletal: Osteopenia with scoliosis and degenerative changes of the spine. Total right hip arthroplasty. No acute osseous pathology. IMPRESSION: 1. 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 There is pericecal nodularity, likely extra colonic extension of the tumor. No bowel obstruction. No drainable fluid collection or abscess. 2. Hepatic and pulmonary metastatic disease. 3. Severe sigmoid diverticulosis. No bowel obstruction. 4. Aortic Atherosclerosis (ICD10-I70.0). These results were called by telephone at the time of interpretation on 12/12/2019 at 10:49 pm to provider [facility] , who verbally acknowledged these results. Electronically Signed   By: Anner Crete M.D.   On: 12/12/2019 22:52    Procedures .Critical Care Performed by: Gareth Morgan, MD Authorized by: Gareth Morgan, MD   Critical care provider statement:    Critical care time (minutes):  45   Critical care was time spent personally by me on the following activities:  Discussions with consultants, evaluation of patient's response to treatment, examination of patient, ordering and performing treatments and interventions, ordering and review of laboratory studies, ordering and review of radiographic studies, pulse oximetry, re-evaluation of patient's condition, obtaining history from patient or surrogate and review of old charts   (including critical care time)  Medications Ordered in ED Medications  piperacillin-tazobactam (ZOSYN) IVPB 3.375 g (has no administration in time range)  lactated ringers bolus 1,000 mL (has no administration in time range)  sodium chloride 0.9 % bolus 1,000 mL (0 mLs Intravenous Stopped 12/12/19  2326)  cefTRIAXone (ROCEPHIN) 1 g in sodium chloride 0.9 % 100 mL IVPB (0 g Intravenous Stopped 12/12/19 2326)  morphine 4 MG/ML injection 4 mg (4 mg Intravenous Given 12/12/19 2216)  ondansetron (ZOFRAN) injection 4 mg (4 mg Intravenous Given 12/12/19 2215)  iohexol (OMNIPAQUE) 300 MG/ML solution 100 mL (100 mLs Intravenous Contrast Given 12/12/19 2226)  piperacillin-tazobactam (ZOSYN) IVPB 3.375 g (0 g Intravenous Stopped 12/13/19 0020)    ED Course  I have reviewed the triage vital signs and the nursing notes.  Pertinent labs & imaging results that were available during my care of the patient were reviewed by me and considered in my medical decision making (see chart for details).    MDM Rules/Calculators/A&P  86 year old female with history of hypertension, breast cancer, admission to [facility] long hospital and July for small bowel obstruction with concern for irregular thickening, at which time patient elected to forego endoscopy and exploratory lap, iron deficiency anemia for which she sees Dr. Harlow Asa hematology/oncology, presents with concern for fever, abdominal pain, nausea and vomiting.  Given rocephin empirically. Mild tachycardia and elevated temperature, leukocytosis, mild lactic acidosis with BP 962X systolic.  CT abdomen pelvis performed which shows a perforated cecal mass malignancy with apparent extension into the terminal ileum and likely extracolonic extension of the tumor without signs of drainable fluid collection or abscess, with signs of hepatic and pulmonary metastatic disease.  Initially discussed likely admission with Dr. Zenia Resides General Surgery at [facility], however patient and family are requesting admission to [facility].  Discussed with Dr. Hassell Done of EGS at [facility] who is accepting patient and recommends ED to ED transfer.  She has been given one liter of NS, one liter of LR, zosyn.     Final Clinical Impression(s) / ED  Diagnoses Final diagnoses:  Perforation bowel ([place])  Colonic mass  Metastatic malignant neoplasm, unspecified site ([place])  Sepsis, due to unspecified organism, unspecified whether acute organ dysfunction present [facility])    Rx / DC Orders ED Discharge Orders    None       Gareth Morgan, MD 12/13/19 [phone]

## 2019-12-12 NOTE — ED Notes (Signed)
Called [facility] line spoke to [facility] regarding pt transfer... General Surgery will called Dr Billy Fischer......[phone]

## 2019-12-12 NOTE — ED Triage Notes (Signed)
Son reports fever and right flank/back pain x 1 day , Chronic UTI

## 2019-12-13 MED ORDER — LACTATED RINGERS IV BOLUS
1000.0000 mL | Freq: Once | INTRAVENOUS | Status: AC
  Administered 2019-12-13: 1000 mL via INTRAVENOUS

## 2019-12-13 NOTE — ED Notes (Signed)
Called [employer] to tranport pt spoke to [employer]

## 2019-12-14 LAB — BLOOD CULTURE ID PANEL (REFLEXED) - BCID2
A.calcoaceticus-baumannii: NOT DETECTED
A.calcoaceticus-baumannii: NOT DETECTED
Bacteroides fragilis: NOT DETECTED
Bacteroides fragilis: NOT DETECTED
Candida albicans: NOT DETECTED
Candida albicans: NOT DETECTED
Candida auris: NOT DETECTED
Candida auris: NOT DETECTED
Candida glabrata: NOT DETECTED
Candida glabrata: NOT DETECTED
Candida krusei: NOT DETECTED
Candida krusei: NOT DETECTED
Candida parapsilosis: NOT DETECTED
Candida parapsilosis: NOT DETECTED
Candida tropicalis: NOT DETECTED
Candida tropicalis: NOT DETECTED
Cryptococcus neoformans/gattii: NOT DETECTED
Cryptococcus neoformans/gattii: NOT DETECTED
Enterobacter cloacae complex: NOT DETECTED
Enterobacter cloacae complex: NOT DETECTED
Enterobacterales: NOT DETECTED
Enterobacterales: NOT DETECTED
Enterococcus Faecium: NOT DETECTED
Enterococcus Faecium: NOT DETECTED
Enterococcus faecalis: NOT DETECTED
Enterococcus faecalis: NOT DETECTED
Escherichia coli: NOT DETECTED
Escherichia coli: NOT DETECTED
Haemophilus influenzae: NOT DETECTED
Haemophilus influenzae: NOT DETECTED
Klebsiella aerogenes: NOT DETECTED
Klebsiella aerogenes: NOT DETECTED
Klebsiella oxytoca: NOT DETECTED
Klebsiella oxytoca: NOT DETECTED
Klebsiella pneumoniae: NOT DETECTED
Klebsiella pneumoniae: NOT DETECTED
Listeria monocytogenes: NOT DETECTED
Listeria monocytogenes: NOT DETECTED
Meth resistant mecA/C and MREJ: DETECTED — AB
Methicillin resistance mecA/C: DETECTED — AB
Neisseria meningitidis: NOT DETECTED
Neisseria meningitidis: NOT DETECTED
Proteus species: NOT DETECTED
Proteus species: NOT DETECTED
Pseudomonas aeruginosa: NOT DETECTED
Pseudomonas aeruginosa: NOT DETECTED
Salmonella species: NOT DETECTED
Salmonella species: NOT DETECTED
Serratia marcescens: NOT DETECTED
Serratia marcescens: NOT DETECTED
Staphylococcus aureus (BCID): DETECTED — AB
Staphylococcus aureus (BCID): NOT DETECTED
Staphylococcus epidermidis: NOT DETECTED
Staphylococcus epidermidis: NOT DETECTED
Staphylococcus lugdunensis: DETECTED — AB
Staphylococcus lugdunensis: NOT DETECTED
Staphylococcus species: DETECTED — AB
Staphylococcus species: NOT DETECTED
Stenotrophomonas maltophilia: NOT DETECTED
Stenotrophomonas maltophilia: NOT DETECTED
Streptococcus agalactiae: NOT DETECTED
Streptococcus agalactiae: NOT DETECTED
Streptococcus pneumoniae: NOT DETECTED
Streptococcus pneumoniae: NOT DETECTED
Streptococcus pyogenes: NOT DETECTED
Streptococcus pyogenes: NOT DETECTED
Streptococcus species: NOT DETECTED
Streptococcus species: NOT DETECTED

## 2019-12-14 LAB — URINE CULTURE

## 2019-12-16 LAB — CULTURE, BLOOD (ROUTINE X 2)
Special Requests: ADEQUATE
Special Requests: ADEQUATE

## 2020-04-18 DEATH — deceased

## 2021-10-25 IMAGING — DX DG ABD PORTABLE 1V
3 series · 3 of 3 positions shown · non-contrast
Comparison: [DATE] a.m.

CLINICAL DATA: Small-bowel obstruction

EXAM:
PORTABLE ABDOMEN - 1 VIEW

[abdomen kub (1 of 3)]
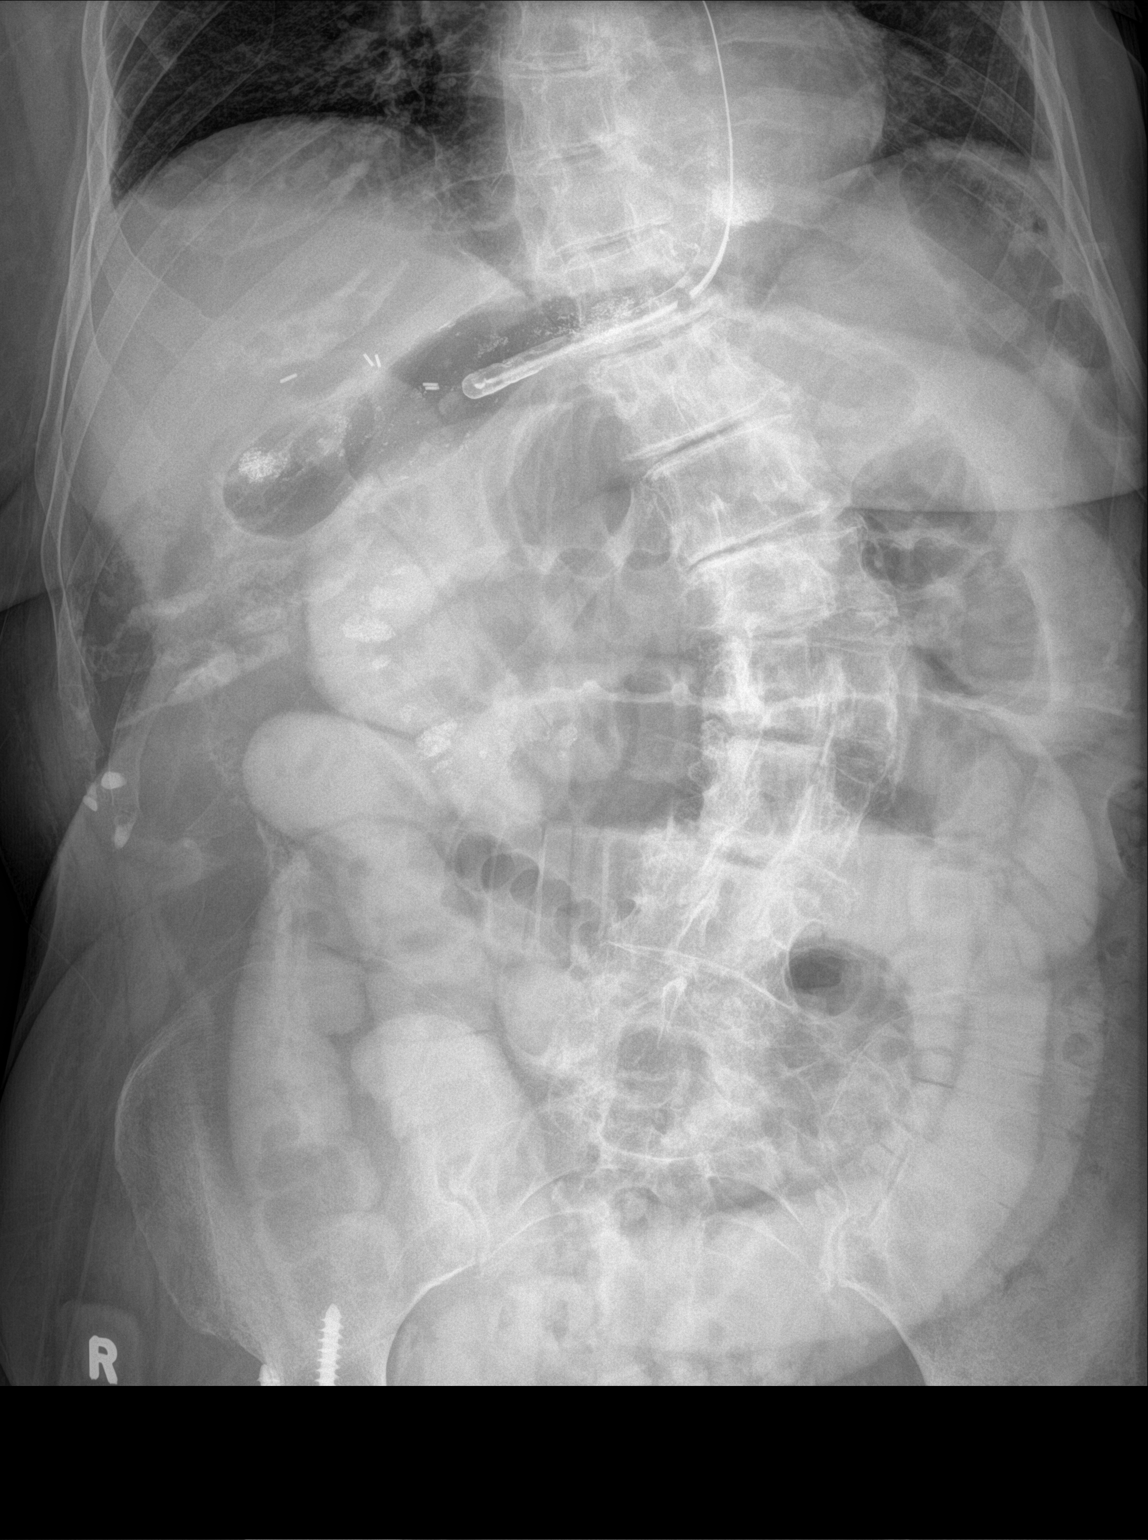

[abdomen kub (2 of 3)]
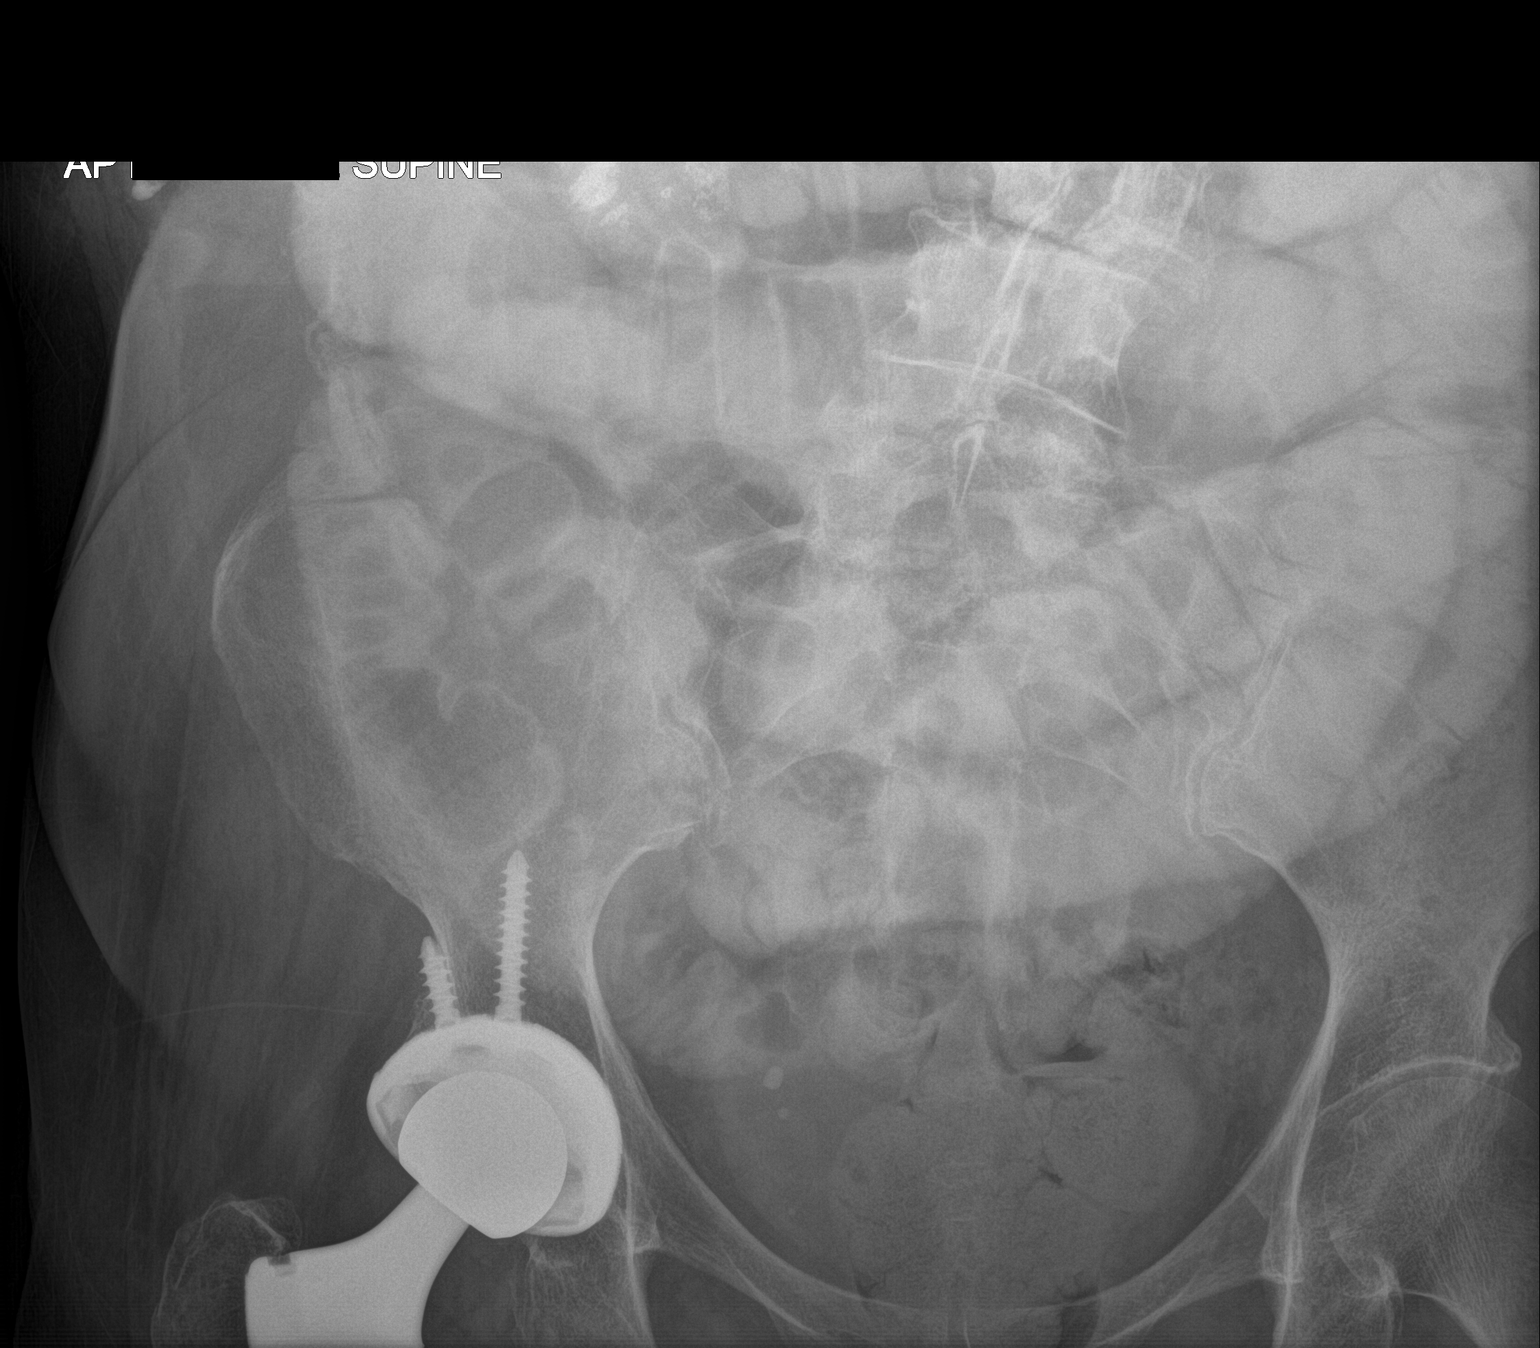

[abdomen kub (3 of 3)]
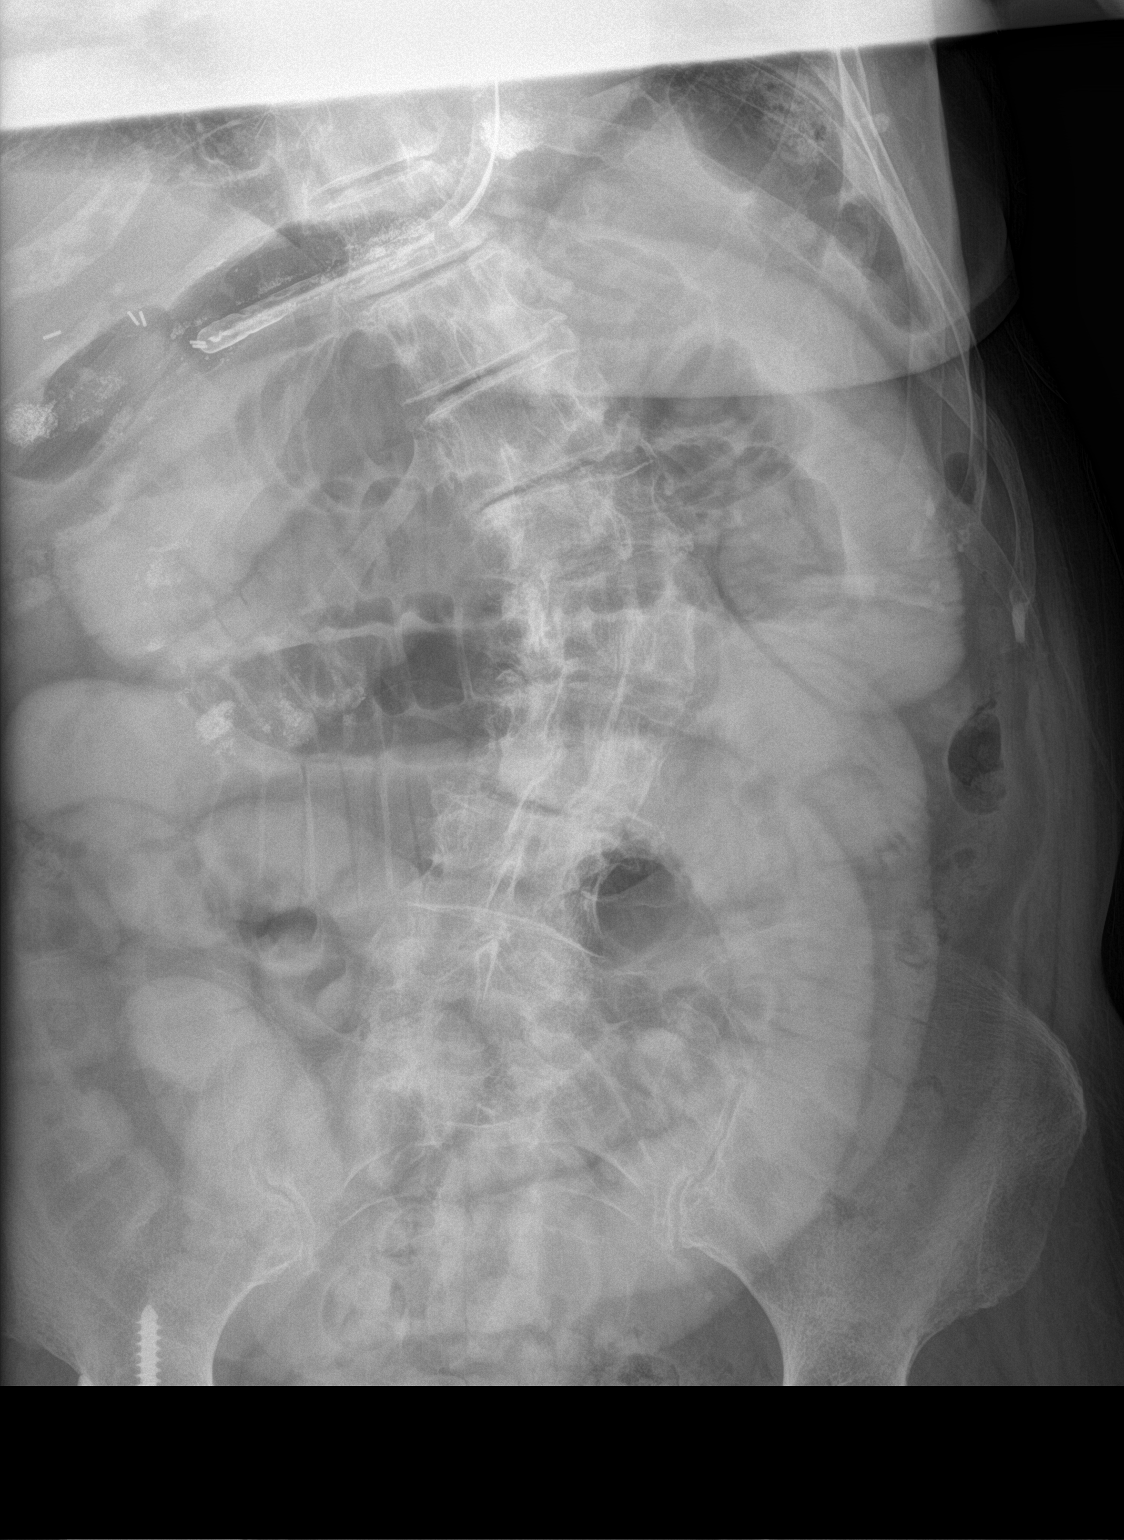

[3 of 3 positions shown; findings below may reference images not displayed]

FINDINGS: Nasogastric tube overlies the mid body of the stomach. Numerous
dilated gas and contrast filled loops of small bowel are seen
throughout the abdomen in keeping with a distal small-bowel
obstruction. The degree of small-bowel dilation appears unchanged
from prior examination. The large bowel appears decompressed. No
gross free intraperitoneal gas. No other changes identified.
IMPRESSION: Stable findings of distal small bowel obstruction. Contrast from
prior CT examination has now progressed into the distal small bowel,
but has not yet passed into the colon.

## 2021-10-25 IMAGING — DX DG ABDOMEN 1V
1 series · 1 of 1 positions shown · non-contrast
Comparison: CT scan 08/01/2019

CLINICAL DATA: Small-bowel obstruction follow-up.

EXAM:
ABDOMEN - 1 VIEW

[abdomen kub]
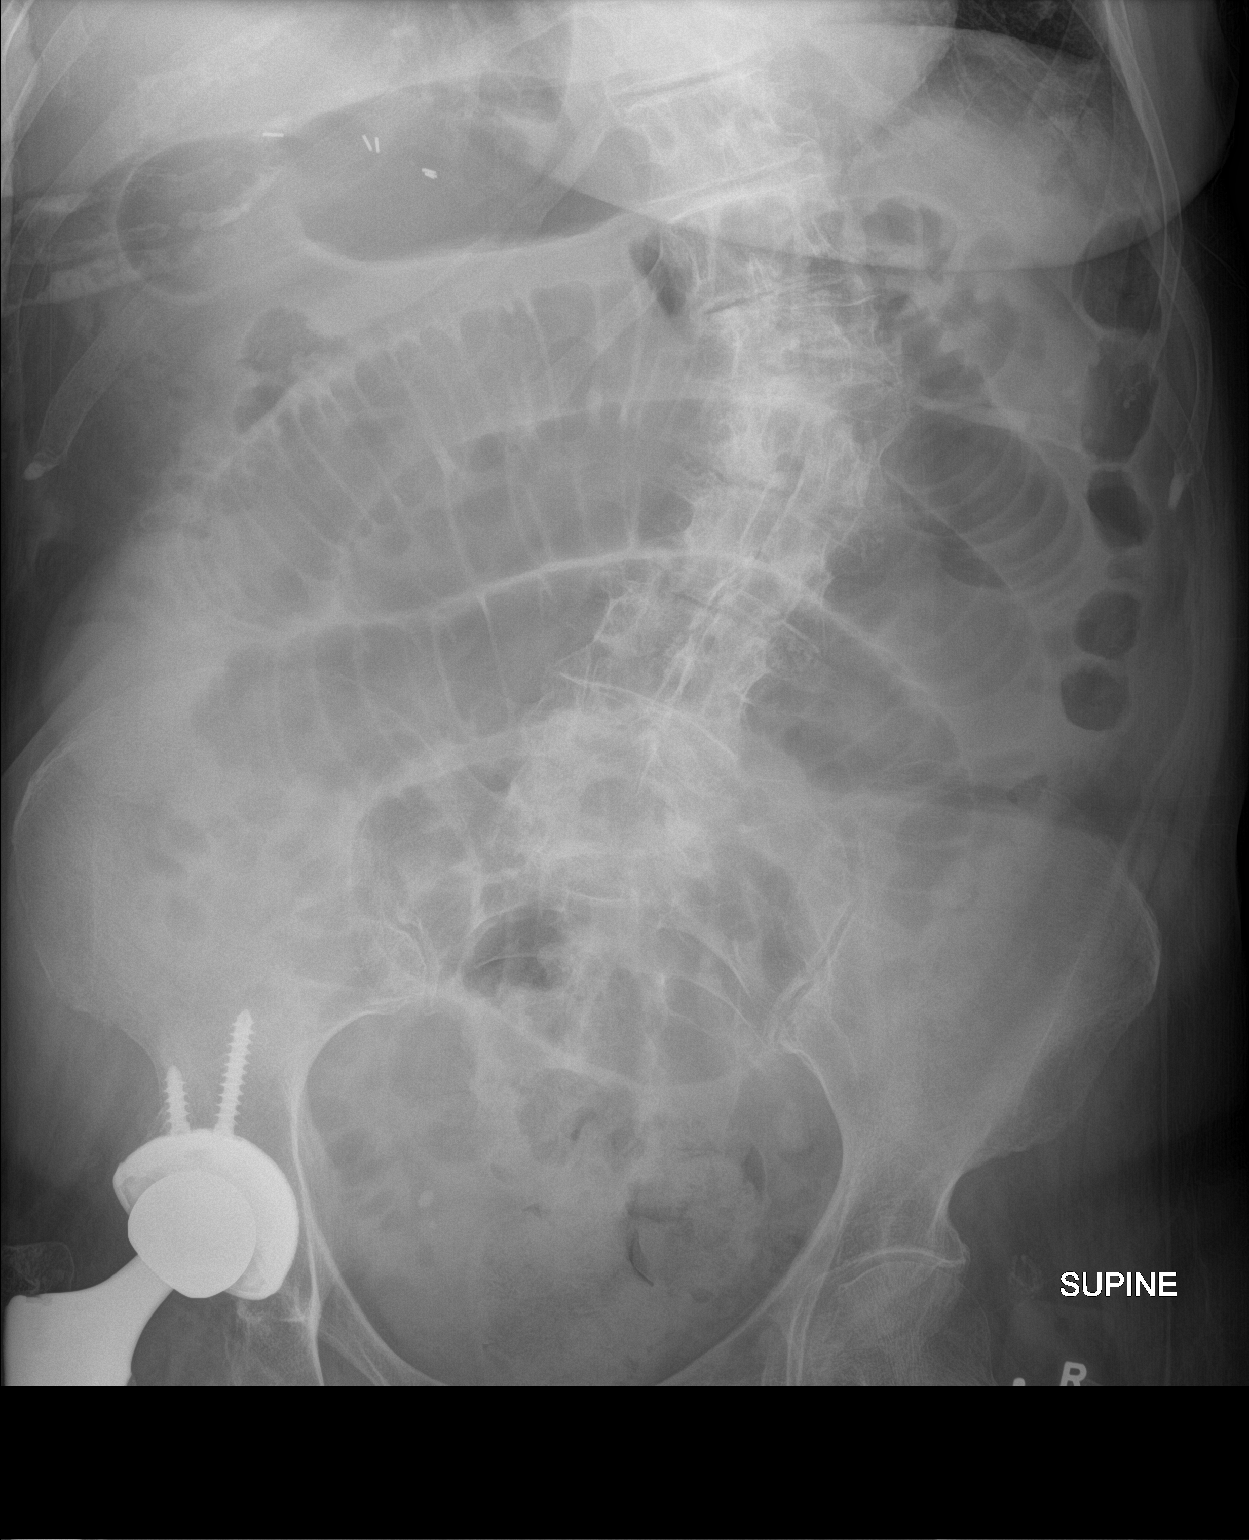

[1 of 1 positions shown; findings below may reference images not displayed]

FINDINGS: Persistent dilated air-filled loops of small bowel consistent with
small-bowel obstruction. Some residual stool in the rectosigmoid
area. No free air is identified.
IMPRESSION: Persistent small-bowel obstruction bowel gas pattern.

## 2021-10-25 IMAGING — DX DG ABD PORTABLE 1V
1 series · 1 of 1 positions shown · non-contrast
Comparison: Earlier same day

CLINICAL DATA: Nasogastric placement

EXAM:
PORTABLE ABDOMEN - 1 VIEW

[abdomen kub]
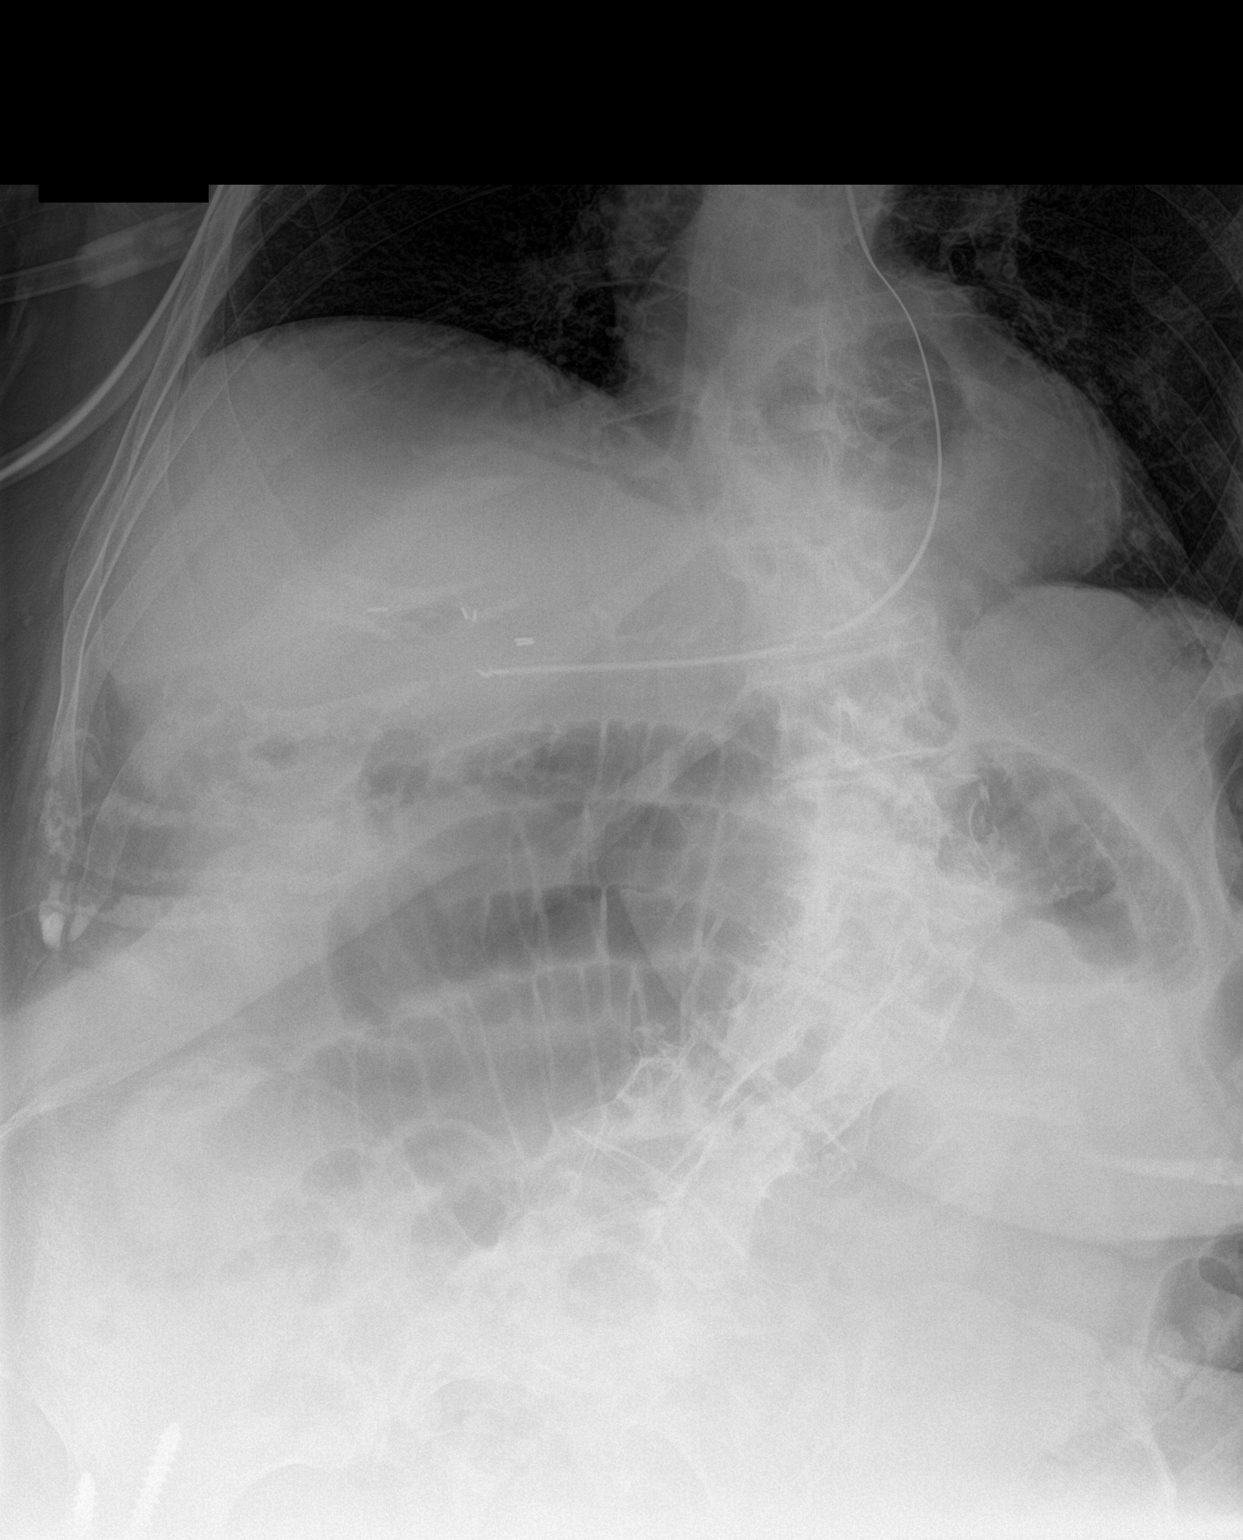

[1 of 1 positions shown; findings below may reference images not displayed]

FINDINGS: Nasogastric 2 passes through the hiatal hernia, under the diaphragm
into the expected location of the gastric antrum or pylorus.
Small-bowel obstruction pattern as seen previously.
IMPRESSION: Nasogastric tube enters the abdomen, with the tip in the expected
location of the antrum or pylorus.
